# Patient Record
Sex: Male | Born: 1980 | Race: Black or African American | Hispanic: No | Marital: Married | Smoking: Never smoker
Health system: Southern US, Community
[De-identification: ages and names within clinical notes are randomized; demographics above are authoritative.]

## PROBLEM LIST (undated history)

## (undated) DIAGNOSIS — I10 Essential (primary) hypertension: Secondary | ICD-10-CM

## (undated) DIAGNOSIS — E785 Hyperlipidemia, unspecified: Secondary | ICD-10-CM

## (undated) DIAGNOSIS — F319 Bipolar disorder, unspecified: Secondary | ICD-10-CM

## (undated) HISTORY — DX: Hyperlipidemia, unspecified: E78.5

## (undated) HISTORY — DX: Essential (primary) hypertension: I10

---

## 2014-06-06 ENCOUNTER — Emergency Department (HOSPITAL_COMMUNITY): Payer: Self-pay

## 2014-06-06 ENCOUNTER — Emergency Department (HOSPITAL_COMMUNITY)
Admission: EM | Admit: 2014-06-06 | Discharge: 2014-06-08 | Disposition: A | Discharge status: Other Acute Inpatient Hospital | Payer: Self-pay | Attending: Emergency Medicine | Admitting: Emergency Medicine

## 2014-06-06 DIAGNOSIS — R000 Tachycardia, unspecified: Secondary | ICD-10-CM | POA: Insufficient documentation

## 2014-06-06 DIAGNOSIS — F23 Brief psychotic disorder: Secondary | ICD-10-CM | POA: Diagnosis present

## 2014-06-06 DIAGNOSIS — R4182 Altered mental status, unspecified: Secondary | ICD-10-CM | POA: Insufficient documentation

## 2014-06-06 HISTORY — DX: Bipolar disorder, unspecified: F31.9

## 2014-06-06 LAB — ETHANOL

## 2014-06-06 LAB — I-STAT TROPONIN, ED: Troponin i, poc: 0.08 ng/mL (ref 0.00–0.08)

## 2014-06-06 MED ORDER — LORAZEPAM 2 MG/ML IJ SOLN
2.0000 mg | Freq: Once | INTRAMUSCULAR | Status: AC
  Administered 2014-06-06: 2 mg via INTRAVENOUS
  Filled 2014-06-06: qty 1

## 2014-06-06 MED ORDER — SODIUM CHLORIDE 0.9 % IV BOLUS (SEPSIS)
1000.0000 mL | Freq: Once | INTRAVENOUS | Status: AC
  Administered 2014-06-06: 1000 mL via INTRAVENOUS

## 2014-06-06 MED ORDER — LORAZEPAM 1 MG PO TABS
2.0000 mg | ORAL_TABLET | Freq: Once | ORAL | Status: AC
  Administered 2014-06-06: 2 mg via ORAL
  Filled 2014-06-06: qty 2

## 2014-06-06 NOTE — ED Notes (Signed)
Pt refuses to allow this RN to carry out orders.

## 2014-06-06 NOTE — ED Notes (Signed)
Pt found wondering the street by [employer], pt not answering questions asked. Pt clammy, HR 160.

## 2014-06-06 NOTE — ED Notes (Signed)
MD at bedside. 

## 2014-06-06 NOTE — ED Provider Notes (Signed)
CSN: [id number]     Arrival date & time 06/06/14  2035 History   First MD Initiated Contact with Patient 06/06/14 2047     No chief complaint on file.  Steven Houston is a [age] y.o. male with an unknown past medical history who presents for altered mental status. This patient was seen by this examiner earlier this evening for altered mental status. After rehydration and observation, the patient had improvement in his mental status and began answering questions appropriately. The patient was then discharged when he was able to ambulate with plans to follow up with his PCP. Several hours later, the patient was brought in by the Police Department for altered mental status. The patient was reportedly walking down the street. The police foresaid the patient did not have any complaints but appeared altered. They brought the patient to the ED for evaluation.  On arrival, the patient appears significantly more altered than a discharge. The patient is diaphoretic, is not verbally communicating, and is appearing very anxious. The patient is biting down on his jaw and is staring at ED staff. The patient will not interact to provide review of systems. The patient's heart rate on arrival is in the 160s.   Given the patient's report to EMS earlier today about crack cocaine use, suspect ingestion between discharge and arrival.    (Consider location/radiation/quality/duration/timing/severity/associated sxs/prior Treatment) Patient is a 34 y.o. male presenting with altered mental status. The history is provided by the patient, the EMS personnel, medical records and the police. The history is limited by the condition of the patient. No language interpreter was used.  Altered Mental Status Presenting symptoms: behavior changes, confusion and partial responsiveness   Presenting symptoms: no lethargy   Severity:  Severe Most recent episode:  Today Episode history:  Multiple Timing:  Constant Progression:   Improving Chronicity:  Recurrent Context: not alcohol use   Associated symptoms: agitation   Associated symptoms: no abdominal pain, no difficulty breathing, no fever, no headaches, no light-headedness, no nausea, no seizures and no vomiting     No past medical history on file. No past surgical history on file. No family history on file. History  Substance Use Topics  . Smoking status: Not on file  . Smokeless tobacco: Not on file  . Alcohol Use: Not on file    Review of Systems  Unable to perform ROS Constitutional: Negative for fever.  Cardiovascular: Negative for chest pain.  Gastrointestinal: Negative for nausea, vomiting and abdominal pain.  Neurological: Negative for seizures, light-headedness and headaches.  Psychiatric/Behavioral: Positive for confusion and agitation.      Allergies  Review of patient's allergies indicates not on file.  Home Medications   Prior to Admission medications   Not on File   BP 138/93 mmHg  Pulse 96  Temp(Src) 98.6 F (37 C) (Oral)  Resp 18  SpO2 100% Physical Exam  Constitutional: He appears well-developed and well-nourished. No distress.  HENT:  Head: Normocephalic and atraumatic.  Mouth/Throat: No oropharyngeal exudate.  Eyes: Conjunctivae and EOM are normal. Pupils are equal, round, and reactive to light.  Neck: Normal range of motion.  Cardiovascular: Regular rhythm, normal heart sounds and intact distal pulses.  Tachycardia present.   No murmur heard. Pulmonary/Chest: Effort normal. No stridor. No respiratory distress. He has no wheezes. He exhibits no tenderness.  Abdominal: Soft. Bowel sounds are normal. He exhibits no distension. There is no tenderness. There is no rebound.  Musculoskeletal: He exhibits no tenderness.  Neurological: He is  alert. He exhibits normal muscle tone. Gait normal. GCS eye subscore is 4. GCS verbal subscore is 5. GCS motor subscore is 6.  Skin: Skin is warm. He is diaphoretic. No erythema. No  pallor.  Psychiatric: His mood appears anxious. He is agitated. He is not combative. He is noncommunicative.  Nursing note and vitals reviewed.   ED Course  Procedures (including critical care time) Labs Review Labs Reviewed  ACETAMINOPHEN LEVEL - Abnormal; Notable for the following:    Acetaminophen (Tylenol), Serum <10 (*)    All other components within normal limits  ETHANOL  SALICYLATE LEVEL  URINALYSIS, ROUTINE W REFLEX MICROSCOPIC (NOT AT [facility])  URINE RAPID DRUG SCREEN (HOSP PERFORMED) NOT AT [facility]  Rosezena Sensor, ED  I-STAT TROPOININ, ED    Imaging Review Ct Head Wo Contrast  06/07/2014   CLINICAL DATA:  Altered mental status.  EXAM: CT HEAD WITHOUT CONTRAST  TECHNIQUE: Contiguous axial images were obtained from the base of the skull through the vertex without intravenous contrast.  COMPARISON:  None.  FINDINGS: Skull and Sinuses:Negative for fracture or destructive process. The mastoids, middle ears, and imaged paranasal sinuses are clear of effusions.  Orbits: No acute abnormality.  Brain: No evidence of acute infarction, hemorrhage, hydrocephalus, or mass lesion/mass effect.  IMPRESSION: Negative head CT.   Electronically Signed   By: Marnee Spring M.D.   On: 06/07/2014 00:08     EKG Interpretation None      MDM   Steven Houston is a 33 y.o. male with an unknown past medical history who presents for altered mental status. As this is the second time that the patient has been seen by this examiner this evening, there is a marked change in the patient's mental status during his interval of discharge. On his return, the patient continued to be nonverbal and appears very anxious. The patient was diaphoretic and had a tachycardia in the 160s. The patient initially refused any IV placement, blood work, or urine studies. The patient was convinced to take an oral dose of Ativan. Following the medication, the patient had an IV placed and became more cooperative. The patient began  answering some questions. The patient denied any new medicines but began saying that "people were programming him."   Given the patient's altered mental status and her labs were repeated and a CT of the head was ordered. The patient began walking around the room and becoming slightly more agitated. The patient was then given a dose of IV Ativan which allowed the patient to rest.  The patient had a troponin obtained which was negative at 0.08 by i-STAT, and his ethanol was negative. The patient also had acetaminophen and salicylate levels drawn. The patient's heart rate improved following the Ativan. The patient decreased his heart rate from the 150s into the 90s.  Following the admission of his paranoid delusions of people programming him, as well as his failure to maintain his safety at discharge, the patient was involuntarily committed for further psychiatric and medical workup.   The patient appeared in safe condition at previous discharge however, the patient proved that he was unable to remain safe as he began wandering down the road and was altered. The involuntary commitment paperwork was filled out by the attending, Dr. Romeo Apple.  The patient's CT head was negative. The patient's alcohol Tylenol and salicylate levels were negative. Pending the patient's urinalysis and UDS, the patient will be medically cleared for further psychiatric evaluation and management.  This patient was seen  with Dr. Romeo Apple, emergency medicine attending.   Final diagnoses:  None          Theda Belfast, MD 06/07/14 1610  Theda Belfast, MD 06/07/14 9604  Purvis Sheffield, MD 06/07/14 [id number]

## 2014-06-06 NOTE — ED Notes (Signed)
CT called and informed pt is ready for CT.

## 2014-06-07 DIAGNOSIS — F23 Brief psychotic disorder: Secondary | ICD-10-CM | POA: Diagnosis present

## 2014-06-07 LAB — COMPREHENSIVE METABOLIC PANEL
ALK PHOS: 55 U/L (ref 38–126)
ALT: 22 U/L (ref 17–63)
ANION GAP: 8 (ref 5–15)
AST: 25 U/L (ref 15–41)
Albumin: 3.5 g/dL (ref 3.5–5.0)
BILIRUBIN TOTAL: 1 mg/dL (ref 0.3–1.2)
BUN: 14 mg/dL (ref 6–20)
CALCIUM: 8.9 mg/dL (ref 8.9–10.3)
CO2: 25 mmol/L (ref 22–32)
Chloride: 109 mmol/L (ref 101–111)
Creatinine, Ser: 0.68 mg/dL (ref 0.61–1.24)
Glucose, Bld: 97 mg/dL (ref 65–99)
POTASSIUM: 3.7 mmol/L (ref 3.5–5.1)
Sodium: 142 mmol/L (ref 135–145)
Total Protein: 6.2 g/dL — ABNORMAL LOW (ref 6.5–8.1)

## 2014-06-07 LAB — RAPID URINE DRUG SCREEN, HOSP PERFORMED
AMPHETAMINES: NOT DETECTED
BARBITURATES: NOT DETECTED
Benzodiazepines: POSITIVE — AB
COCAINE: NOT DETECTED
Opiates: NOT DETECTED
TETRAHYDROCANNABINOL: NOT DETECTED

## 2014-06-07 LAB — TSH: TSH: 0.953 u[IU]/mL (ref 0.350–4.500)

## 2014-06-07 LAB — URINALYSIS, ROUTINE W REFLEX MICROSCOPIC
Glucose, UA: NEGATIVE mg/dL
HGB URINE DIPSTICK: NEGATIVE
Ketones, ur: 40 mg/dL — AB
Leukocytes, UA: NEGATIVE
Nitrite: NEGATIVE
PH: 5.5 (ref 5.0–8.0)
PROTEIN: 30 mg/dL — AB
Specific Gravity, Urine: 1.029 (ref 1.005–1.030)
UROBILINOGEN UA: 1 mg/dL (ref 0.0–1.0)

## 2014-06-07 LAB — ACETAMINOPHEN LEVEL: Acetaminophen (Tylenol), Serum: <10 ug/mL — ABNORMAL LOW (ref 10–30)

## 2014-06-07 LAB — I-STAT TROPONIN, ED
Troponin i, poc: 0.01 ng/mL (ref 0.00–0.08)
Troponin i, poc: 0.02 ng/mL (ref 0.00–0.08)

## 2014-06-07 LAB — SALICYLATE LEVEL: Salicylate Lvl: <4 mg/dL (ref 2.8–30.0)

## 2014-06-07 LAB — URINE MICROSCOPIC-ADD ON

## 2014-06-07 MED ORDER — LORAZEPAM 2 MG/ML IJ SOLN
0.0000 mg | Freq: Two times a day (BID) | INTRAMUSCULAR | Status: DC
  Administered 2014-06-07: 1 mg via INTRAVENOUS

## 2014-06-07 MED ORDER — LORAZEPAM 1 MG PO TABS: 0.0000 mg | ORAL_TABLET | Freq: Four times a day (QID) | ORAL | Status: DC

## 2014-06-07 MED ORDER — SODIUM CHLORIDE 0.9 % IV BOLUS (SEPSIS)
1000.0000 mL | Freq: Once | INTRAVENOUS | Status: AC
  Administered 2014-06-07: 1000 mL via INTRAVENOUS

## 2014-06-07 MED ORDER — VITAMIN B-1 100 MG PO TABS
100.0000 mg | ORAL_TABLET | Freq: Every day | ORAL | Status: DC
  Filled 2014-06-07: qty 1

## 2014-06-07 MED ORDER — LORAZEPAM 2 MG/ML IJ SOLN
0.0000 mg | Freq: Four times a day (QID) | INTRAMUSCULAR | Status: DC
  Filled 2014-06-07: qty 1

## 2014-06-07 MED ORDER — THIAMINE HCL 100 MG/ML IJ SOLN
100.0000 mg | Freq: Every day | INTRAMUSCULAR | Status: DC
  Filled 2014-06-07: qty 2

## 2014-06-07 MED ORDER — LORAZEPAM 1 MG PO TABS: 0.0000 mg | ORAL_TABLET | Freq: Two times a day (BID) | ORAL | Status: DC

## 2014-06-07 NOTE — ED Notes (Signed)
The patient went into SVT up heart rate was up to 160bpm.  I advised Dr. Wilkie AyeHorton and she ordered an EKG.  Patient's heart rate came down to the 90's after about 2 minutes.  No other orders given.

## 2014-06-07 NOTE — [facility] Counselor (Signed)
TTS attempted to assess the Pt but the Pt would not communicate. Dr. Elsie SaasJonnalagadda was contacted. Dr. Elsie SaasJonnalagadda [facility]  Wolfgang PhoenixBrandi Doran Nestle, [facility] Triage Specialist

## 2014-06-07 NOTE — ED Notes (Signed)
Patient's IVC papers were given to Lequita HaltMorgan, Charity fundraiserN.  Patient was transferred back to D-34 to keep a closer eye on his condition.

## 2014-06-07 NOTE — Consult Note (Signed)
[facility] Face-to-Face Psychiatry Consult   Reason for Consult:  Psychosis Referring Physician:  EDP Patient Identification: Steven Houston MRN:  [id number] Principal Diagnosis: Acute psychosis Diagnosis:   Patient Active Problem List   Diagnosis Date Noted  . Acute psychosis [F29] 06/07/2014    Total Time spent with patient: 30 minutes  Subjective:   Steven Houston is a 34 y.o. male patient admitted with acute psychosis  HPI:  Steven Houston is an 35 y.o. Male seen for psychiatric consultation and evaluation of psychosis in [facility] emergency department. Patient was failed to respond to telly psychiatry and was seen face-to-face for this evaluation. Patient appeared lying down on his bed and turning to on his left, he has been poor historian, less verbally response during this evaluation. Patient occasionally nod his head and whispering but difficult to follow through his thought process at this time. Patient was brought in by [facility] department because he was found to be wandering on the street with the bizarre behaviors and psychotic statements like "is a still a blood bath out there? And "there are people eating each other out there". It is a blood bath. There are fires everywhere."He then asked if it was really 2016. The patient also said, "I drank nuclear stuff".He then said, "the beast has not been released."And "the monster has not been released for the blood bath." There was no collateral contact information.  Safety sitter stated that he was able to talk when he wants to and reportedly has a plan of leaving the hospital when he get rested well.   Past psychiatric history: Unknown Social history: Unknown   Past Medical History: No past medical history on file. No past surgical history on file. Family History: No family history on file. Social History:  History  Alcohol Use: Not on file     History  Drug Use Not on file    History   Social History  . Marital Status:  Unknown    Spouse Name: N/A  . Number of Children: N/A  . Years of Education: N/A   Social History Main Topics  . Smoking status: Not on file  . Smokeless tobacco: Not on file  . Alcohol Use: Not on file  . Drug Use: Not on file  . Sexual Activity: Not on file   Other Topics Concern  . Not on file   Social History Narrative  . No narrative on file   Additional Social History:    Pain Medications: UTA Prescriptions: UTA Over the Counter: UTA History of alcohol / drug use?: No history of alcohol / drug abuse Longest period of sobriety (when/how long): UTA                     Allergies:   Allergies  Allergen Reactions  . Haldol [Haloperidol Lactate]     Labs:  Results for orders placed or performed during the hospital encounter of 06/06/14 (from the past 48 hour(s))  Ethanol     Status: None   Collection Time: 06/06/14 10:40 PM  Result Value Ref Range   Alcohol, Ethyl (B) <5 <5 mg/dL    Comment:        LOWEST DETECTABLE LIMIT FOR SERUM ALCOHOL IS 11 mg/dL FOR MEDICAL PURPOSES ONLY   Acetaminophen level     Status: Abnormal   Collection Time: 06/06/14 10:40 PM  Result Value Ref Range   Acetaminophen (Tylenol), Serum <10 (L) 10 - 30 ug/mL    Comment:  THERAPEUTIC CONCENTRATIONS VARY SIGNIFICANTLY. A RANGE OF 10-30 ug/mL MAY BE AN EFFECTIVE CONCENTRATION FOR MANY PATIENTS. HOWEVER, SOME ARE BEST TREATED AT CONCENTRATIONS OUTSIDE THIS RANGE. ACETAMINOPHEN CONCENTRATIONS >150 ug/mL AT 4 HOURS AFTER INGESTION AND >50 ug/mL AT 12 HOURS AFTER INGESTION ARE OFTEN ASSOCIATED WITH TOXIC REACTIONS.   Salicylate level     Status: None   Collection Time: 06/06/14 10:40 PM  Result Value Ref Range   Salicylate Lvl <4.0 2.8 - 30.0 mg/dL  I-Stat Troponin, ED - 0, 3, 6 hours ([facility])     Status: None   Collection Time: 06/06/14 10:47 PM  Result Value Ref Range   Troponin i, poc 0.08 0.00 - 0.08 ng/mL   Comment 3            Comment: Due to the  release kinetics of cTnI, a negative result within the first hours of the onset of symptoms does not rule out myocardial infarction with certainty. If myocardial infarction is still suspected, repeat the test at appropriate intervals.   Urinalysis, Routine w reflex microscopic     Status: Abnormal   Collection Time: 06/07/14 12:12 AM  Result Value Ref Range   Color, Urine YELLOW YELLOW   APPearance CLOUDY (A) CLEAR   Specific Gravity, Urine 1.029 1.005 - 1.030   pH 5.5 5.0 - 8.0   Glucose, UA NEGATIVE NEGATIVE mg/dL   Hgb urine dipstick NEGATIVE NEGATIVE   Bilirubin Urine MODERATE (A) NEGATIVE   Ketones, ur 40 (A) NEGATIVE mg/dL   Protein, ur 30 (A) NEGATIVE mg/dL   Urobilinogen, UA 1.0 0.0 - 1.0 mg/dL   Nitrite NEGATIVE NEGATIVE   Leukocytes, UA NEGATIVE NEGATIVE  Drug screen panel, emergency     Status: Abnormal   Collection Time: 06/07/14 12:12 AM  Result Value Ref Range   Opiates NONE DETECTED NONE DETECTED   Cocaine NONE DETECTED NONE DETECTED   Benzodiazepines POSITIVE (A) NONE DETECTED   Amphetamines NONE DETECTED NONE DETECTED   Tetrahydrocannabinol NONE DETECTED NONE DETECTED   Barbiturates NONE DETECTED NONE DETECTED    Comment:        DRUG SCREEN FOR MEDICAL PURPOSES ONLY.  IF CONFIRMATION IS NEEDED FOR ANY PURPOSE, NOTIFY LAB WITHIN 5 DAYS.        LOWEST DETECTABLE LIMITS FOR URINE DRUG SCREEN Drug Class       Cutoff (ng/mL) Amphetamine      1000 Barbiturate      200 Benzodiazepine   200 Tricyclics       300 Opiates          300 Cocaine          300 THC              50   Urine microscopic-add on     Status: None   Collection Time: 06/07/14 12:12 AM  Result Value Ref Range   Squamous Epithelial / LPF RARE RARE   WBC, UA 0-2 <3 WBC/hpf   RBC / HPF 0-2 <3 RBC/hpf   Bacteria, UA RARE RARE   Urine-Other AMORPHOUS URATES/PHOSPHATES   I-Stat Troponin, ED - 0, 3, 6 hours ([facility])     Status: None   Collection Time: 06/07/14 12:55 AM  Result Value Ref  Range   Troponin i, poc 0.01 0.00 - 0.08 ng/mL   Comment 3            Comment: Due to the release kinetics of cTnI, a negative result within the first hours of the  onset of symptoms does not rule out myocardial infarction with certainty. If myocardial infarction is still suspected, repeat the test at appropriate intervals.   I-Stat Troponin, ED - 0, 3, 6 hours (not at [facility])     Status: None   Collection Time: 06/07/14  3:31 AM  Result Value Ref Range   Troponin i, poc 0.02 0.00 - 0.08 ng/mL   Comment 3            Comment: Due to the release kinetics of cTnI, a negative result within the first hours of the onset of symptoms does not rule out myocardial infarction with certainty. If myocardial infarction is still suspected, repeat the test at appropriate intervals.     Vitals: Blood pressure 133/66, pulse 121, temperature 97.9 F (36.6 C), temperature source Axillary, resp. rate 26, SpO2 99 %.  Risk to Self: Suicidal Ideation: No (UTA) Suicidal Intent: No (UTA) Is patient at risk for suicide?: No (UTA) Suicidal Plan?: No (UTA) Access to Means: No (UTA) What has been your use of drugs/alcohol within the last 12 months?: UTA How many times?: 0 (UTA) Other Self Harm Risks:  (UTA) Triggers for Past Attempts: Other (Comment) (UTA) Intentional Self Injurious Behavior: None (UTA) Risk to Others: Homicidal Ideation: No Thoughts of Harm to Others: No (UTA) Current Homicidal Intent:  (UTA) Current Homicidal Plan:  (UTA) Access to Homicidal Means:  (UTa) Identified Victim: UTA History of harm to others?: No (UTA) Assessment of Violence: None Noted Violent Behavior Description: UTA Does patient have access to weapons?: No (UTA) Criminal Charges Pending?: No (UTA) Does patient have a court date: No (UTA) Prior Inpatient Therapy: Prior Inpatient Therapy: No (UTA) Prior Therapy Dates: NA Prior Therapy Facilty/Provider(s): NA Reason for Treatment: NA Prior Outpatient Therapy: Prior  Outpatient Therapy: No (UTA) Prior Therapy Dates: NA Prior Therapy Facilty/Provider(s): NA Reason for Treatment: NA Does patient have an ACCT team?: Unknown Does patient have Intensive In-House Services?  : Unknown Does patient have [facility] services? : Unknown Does patient have P4CC services?: Unknown  Current Facility-Administered Medications  Medication Dose Route Frequency Provider Last Rate Last Dose  . LORazepam (ATIVAN) injection 0-4 mg  0-4 mg Intravenous 4 times per day Shon Baton, MD   0 mg at 06/07/14 0910  . LORazepam (ATIVAN) injection 0-4 mg  0-4 mg Intravenous Q12H Shon Baton, MD   1 mg at 06/07/14 [phone]  . LORazepam (ATIVAN) tablet 0-4 mg  0-4 mg Oral 4 times per day Shon Baton, MD   0 mg at 06/07/14 0912  . LORazepam (ATIVAN) tablet 0-4 mg  0-4 mg Oral Q12H Shon Baton, MD   0 mg at 06/07/14 0911  . thiamine (B-1) injection 100 mg  100 mg Intravenous Daily Shon Baton, MD   100 mg at 06/07/14 0951  . thiamine (VITAMIN B-1) tablet 100 mg  100 mg Oral Daily Shon Baton, MD   100 mg at 06/07/14 9604   No current outpatient prescriptions on file.    Musculoskeletal: Strength & Muscle Tone: decreased Gait & Station: unable to stand Patient leans: N/A  Psychiatric Specialty Exam: Physical Exam  ROS  Blood pressure 133/66, pulse 121, temperature 97.9 F (36.6 C), temperature source Axillary, resp. rate 26, SpO2 99 %.There is no height or weight on file to calculate BMI.  General Appearance: Disheveled and Guarded  Eye Contact::  Minimal  Speech:  Blocked and Slow  Volume:  Decreased  Mood:  Depressed  Affect:  Blunt  Thought Process:  Disorganized and Irrelevant  Orientation:  Full (Time, Place, and Person)  Thought Content:  Ideas of Reference:   Delusions and Paranoid Ideation  Suicidal Thoughts:  No  Homicidal Thoughts:  No  Memory:  Immediate;   Poor Recent;   Poor  Judgement:  Poor  Insight:  Shallow  Psychomotor  Activity:  Decreased  Concentration:  Poor  Recall:  Poor  Fund of Knowledge:Poor  Language: Fair  Akathisia:  Negative  Handed:  Right  AIMS (if indicated):     Assets:  Others:   unable to assess at this time  ADL's:  Impaired  Cognition: Impaired,  Moderate  Sleep:      Medical Decision Making: New problem, with additional work up planned, Review of Psycho-Social Stressors (1), Review or order clinical lab tests (1), Review of Last Therapy Session (1), Review or order medicine tests (1), Review of Medication Regimen & Side Effects (2) and Review of New Medication or Change in Dosage (2)  Treatment Plan Summary: Patient presented with acute psychosis and questionable history of schizophrenia or drug-induced psychosis. Patient has blunt affect, limited verbal responses, poor eye contact and dysphoric and occasionally. Patient was noticed talking with the staff behind the door but not given clear information about his past or present problems. No collateral Information is available at this time. Daily contact with patient to assess and evaluate symptoms and progress in treatment and Medication management  Plan:  Will check comprehensive metabolic panel for possible electrolyte imbalance or metabolic problems along with thyroid panel for hypo-or hyperthyroidism. Psychosis NOS: May use risperidone 0.5 mg twice daily and Haldol 2 mg IM every 6 hours for agitation and aggressive behavior Monitor for alcohol withdrawal symptoms Recommend psychiatric Inpatient admission when medically cleared. Supportive therapy provided about ongoing stressors.  Appreciate psychiatric consultation Please contact [phone] or [phone] if needs further assistance  Disposition: Patient need to be reevaluated for acute psychiatric hospitalization as patient is poor historian and unable to gather most of the collecting information at this time   Judy Pollman,JANARDHAHA R. 06/07/2014 12:51 PM

## 2014-06-07 NOTE — ED Notes (Signed)
Patient's oxygen levels dropped to the 80's so I placed him on 2L/[place].  MD aware.

## 2014-06-07 NOTE — BH Assessment (Addendum)
Tele Assessment Note   Steven OvensMichael Houston is an 34 y.o. male. Writer attempted to conduct an assessment. Pt would not respond to the writer. There was no collateral contact information in the Pt's chart.   Collateral information gathered from RN's notes: "the patient woke up and asked, "is there still a blood bath out there?" I advised him that everything was okay and that [facility]. He then said, "there are people eating each other out there. It is a blood bath. There are fires everywhere." I reoriented him and also advised him that he was walking out in front of traffic and the police brought him in. He then asked if it was really 2016. The patient also said, "I drank nuclear stuff". I asked if someone had given it to him and he said he got it. He then said, "the beast has not been released." I did not understand what he has said so I asked him to repeat it and he said, "the monster has not been released for the blood bath." I then told him it was almost 0500hrs and he should get some rest. He said, "yes ma'am."   Writer contacted Dr. Shela CommonsJ to assess the Pt. Dr[facility]J [facility]  Axis I: Psychotic Disorder NOS Axis II: Deferred Axis III: No past medical history on file. Axis IV: problems with access to health care services Axis V: 31-40 impairment in reality testing  Past Medical History: No past medical history on file.  No past surgical history on file.  Family History: No family history on file.  Social History:  has no tobacco, alcohol, and drug history on file.  Additional Social History:  Alcohol / Drug Use Pain Medications: UTA Prescriptions: UTA Over the Counter: UTA History of alcohol / drug use?: No history of alcohol / drug abuse Longest period of sobriety (when/how long): UTA  CIWA: CIWA-Ar BP: 143/88 mmHg Pulse Rate: 111 Nausea and Vomiting: no nausea and no vomiting Tactile Disturbances: none Tremor: no tremor Auditory  Disturbances: mild harshness or ability to frighten Paroxysmal Sweats: three Visual Disturbances: severe hallucinations Anxiety: no anxiety, at ease Headache, Fullness in Head: none present Agitation: normal activity Orientation and Clouding of Sensorium: oriented and can do serial additions CIWA-Ar Total: 10 COWS:    PATIENT STRENGTHS: (choose at least two) Motivation for treatment/growth Physical Health  Allergies:  Allergies  Allergen Reactions  . Haldol [Haloperidol Lactate]     Home Medications:  (Not in a hospital admission)  OB/GYN Status:  No LMP for male patient.  General Assessment Data [facility] Assessment: In system Is this a Tele or Face-to-Face Assessment?: Tele Assessment Is this an Initial Assessment or a Re-assessment for this encounter?: Initial Assessment Marital status: Other (comment) Maiden name: Cristela FeltDuff Is patient pregnant?: No Pregnancy Status: No Living Arrangements:  (UTA) Can pt return to current living arrangement?:  (UTA) Admission Status: Involuntary Is patient capable of signing voluntary admission?: No Referral Source:  ([employer]) Insurance type: SP     Crisis Care Plan Living Arrangements:  (UTA) Name of Psychiatrist: UTA Name of Therapist: UTA  Education Status Is patient currently in school?: No Current Grade: UTA Highest grade of school patient has completed: UTA Name of school: UTA Contact person: NA  Risk to self with the past 6 months Suicidal Ideation: No (UTA) Has patient been a risk to self within the past 6 months prior to admission? : Other (  comment) (UTA) Suicidal Intent: No (UTA) Has patient had any suicidal intent within the past 6 months prior to admission? : Other (comment) (UTA) Is patient at risk for suicide?: No (UTA) Suicidal Plan?: No (UTA) Has patient had any suicidal plan within the past 6 months prior to admission? : Other (comment) (UTA) Access to Means: No (UTA) What has been  your use of drugs/alcohol within the last 12 months?: UTA Previous Attempts/Gestures: No (UTA) How many times?: 0 (UTA) Other Self Harm Risks:  (UTA) Triggers for Past Attempts: Other (Comment) (UTA) Intentional Self Injurious Behavior: None (UTA) Family Suicide History: Unknown (UTA) Recent stressful life event(s): Other (Comment) (UTA) Persecutory voices/beliefs?: No (UTA) Depression: No (UTA) Depression Symptoms:  (UTA) Substance abuse history and/or treatment for substance abuse?:  (UTA) Suicide prevention information given to non-admitted patients: Not applicable  Risk to Others within the past 6 months Homicidal Ideation: No Does patient have any lifetime risk of violence toward others beyond the six months prior to admission? : Unknown Thoughts of Harm to Others: No (UTA) Current Homicidal Intent:  (UTA) Current Homicidal Plan:  (UTA) Access to Homicidal Means:  (UTa) Identified Victim: UTA History of harm to others?: No (UTA) Assessment of Violence: None Noted Violent Behavior Description: UTA Does patient have access to weapons?: No (UTA) Criminal Charges Pending?: No (UTA) Does patient have a court date: No (UTA) Is patient on probation?: Unknown  Psychosis Hallucinations: None noted (UTA) Delusions: None noted (UTA)  Mental Status Report Appearance/Hygiene: Unable to Assess Eye Contact: Unable to Assess Motor Activity: Unable to assess Speech: Unable to assess Level of Consciousness: Unable to assess Mood: Other (Comment) (UTA) Affect: Other (Comment) (UTA) Anxiety Level: None (UTA) Thought Processes: Unable to Assess Judgement: Unable to Assess Orientation: Unable to assess Obsessive Compulsive Thoughts/Behaviors: Unable to Assess  Cognitive Functioning Concentration: Unable to Assess Memory: Unable to Assess IQ: Average (UTA) Insight: Unable to Assess Impulse Control: Unable to Assess Appetite: Fair (UTA) Weight Loss: 0 Weight Gain: 0 Sleep:  Unable to Assess Total Hours of Sleep: 0 Vegetative Symptoms: Unable to Assess  ADLScreening [facility] Assessment Services) Patient's cognitive ability adequate to safely complete daily activities?: Yes Patient able to express need for assistance with ADLs?: No Independently performs ADLs?: Yes (appropriate for developmental age)  Prior Inpatient Therapy Prior Inpatient Therapy: No (UTA) Prior Therapy Dates: NA Prior Therapy Facilty/Provider(s): NA Reason for Treatment: NA  Prior Outpatient Therapy Prior Outpatient Therapy: No (UTA) Prior Therapy Dates: NA Prior Therapy Facilty/Provider(s): NA Reason for Treatment: NA Does patient have an ACCT team?: Unknown Does patient have Intensive In-House Services?  : Unknown Does patient have [facility] services? : Unknown Does patient have P4CC services?: Unknown  ADL Screening (condition at time of admission) Patient's cognitive ability adequate to safely complete daily activities?: Yes Is the patient deaf or have difficulty hearing?: No Does the patient have difficulty seeing, even when wearing glasses/contacts?: No Does the patient have difficulty concentrating, remembering, or making decisions?: No Patient able to express need for assistance with ADLs?: No Does the patient have difficulty dressing or bathing?: No Independently performs ADLs?: Yes (appropriate for developmental age) Does the patient have difficulty walking or climbing stairs?: No Weakness of Legs: None Weakness of Arms/Hands: None       Abuse/Neglect Assessment (Assessment to be complete while patient is alone) Physical Abuse: Denies Verbal Abuse: Denies Sexual Abuse: Denies Exploitation of patient/patient's resources: Denies Self-Neglect: Denies Values / Beliefs Cultural Requests During Hospitalization: None Spiritual Requests During Hospitalization:  None   Advance Directives (For Healthcare) Does patient have an advance directive?: No Would patient like  information on creating an advanced directive?: No - patient declined information    Additional Information 1:1 In Past 12 Months?: No CIRT Risk: No Elopement Risk: No Does patient have medical clearance?: No     Disposition:  Disposition Initial Assessment Completed for this Encounter: Yes Disposition of Patient: Other dispositions (Pending psych evaluation by Dr. Shela Commons) Other disposition(s): Other (Comment)  Jerrica Thorman D 06/07/2014 10:52 AM

## 2014-06-07 NOTE — ED Notes (Signed)
Patient is snoring. Sitter at the bedside. Patient is placed on the cardiac monitor.

## 2014-06-07 NOTE — Progress Notes (Signed)
CSW seeking inpt placement for pt as he awaits re-evaluation 06/08/14 a.m.   [facility] per [facility]- per Alfredia Clientarla [facility]- per Jill AlexandersJustin (no IPRS beds today but fax for review for waitlist)  At capacity: [facility]- [facility]- per Jonah Blue[facility]resa [facility]- [facility]- [facility]- per [facility]- [facility]- [facility]- per Alinda Moneyony  Left voicemail for [facility].  Ilean SkillMeghan Jadalynn Burr, MSW, LCSWA Clinical Social Work, Disposition  06/07/2014 [phone]

## 2014-06-07 NOTE — ED Notes (Signed)
Patient is back to sleep, and snoring .  Sitter is at the bedside.

## 2014-06-07 NOTE — ED Notes (Signed)
BH tried to telepsych with patient.  Patient would not talk with Gearldine BienenstockBrandy but would only look down.  Gearldine BienenstockBrandy is going to try and get Dr. Shela CommonsJ to round on the patient.

## 2014-06-07 NOTE — ED Notes (Signed)
In doing hourly rounding, the patient woke up and asked, "is there still a blood bath out there?"  I advised him that everything was okay and [facility]  He then said, "there are people eating each other out there.  It is a blood bath. There are fires everywhere."  I reoriented him and also advised him that he was walking out in front of traffic and the police brought him in.  He then asked if it was really 2016.  The patient also said, "I drank nuclear stuff".  I asked if someone had given it to him and he said he got it.  He then said, "the beast has not been released."  I did not understand what he has said so I asked him to repeat it and he said, "the monster has not been released for the blood bath."  I then told him it was almost 0500hrs and he should get some rest. He said, "yes ma'am."

## 2014-06-07 NOTE — ED Notes (Signed)
Patient eating dinner tray. 

## 2014-06-07 NOTE — ED Provider Notes (Signed)
Patient signed out and shift pending urinalysis. Urinalysis is unremarkable. Patient is status post Ativan and unable to be evaluated by TTS at this time.  Patient was initially moved to pod see. However, he had multiple episodes of tachycardia into the 150s. This seemed to be intermittent and unprovoked. Patient was given a second liter of fluid and moved back to the main ER. On questioning, patient reports he has a history of tachycardia but will not elaborate. He is unsure whether he is on medications.  Denies alcohol abuse. Patient was placed on CIWA.  Patient will need to be reevaluated. Repeat EKG appears to be sinus tachycardia.  Will sign out to to oncoming physician.  Shon Batonourtney F Horton, MD 06/07/14 [phone]

## 2014-06-07 NOTE — ED Notes (Signed)
MD Jonnalagadda at the bedside.

## 2014-06-07 NOTE — ED Notes (Signed)
Patient is asleep with sitter at the bedside. 

## 2014-06-07 NOTE — ED Notes (Signed)
Patient awaken from his sleep, began to ask if there was a blood bath out there in the street from people eating other people. Patient continued to say that there was a monster (a beast) that was not yet release for the blood bath. Patient stated that he had taken some things that he shouldn't have taken. Patient went on to state that he had drunken some nuclear liquid. Nurse encouraged patient to relax and try to get some rest, assured him that he was safe in the hospital.

## 2014-06-07 NOTE — Progress Notes (Addendum)
Patient to be re-evaluated on Fri 5/27 morning.  Pt's referral was followed up at/faxed to: [facility] - per Morrie SheldonAshley, re-fax it. Referral faxed. Per Beth, pt on watlist for a IPRS bed. (Beth inquired if pt ambulatory/if does ADL's, Waynetta SandyBeth was informed that per RN Windy KalataYasemia, pt refuses to do his ADL's and not ambulatory). [facility], re-fax referral at [phone]. Referral faxed. [facility] - per Thayer Ohmhris, referral received and will be reviewed by am staff. No beds tonight but d/cs tomorrow.Referral faxed. [facility] - per Ray[facility] Citizenaroline, fax referral for the waitlist. Referral faxed. [facility] - per Dennie BiblePat, fax referral for waitlist.Referral faxed. [facility]- per Dewayne HatchAnn, fax referral.Referral faxed.  At capacity: [facility]- per Brion [facility]iment[facility]an [facility]   CSW will continue to seek placement.  Melbourne Abtsatia Aireana Ryland, LCSWA Disposition staff 06/07/2014 6:32 PM

## 2014-06-08 ENCOUNTER — Encounter (HOSPITAL_COMMUNITY): Payer: Self-pay | Admitting: Emergency Medicine

## 2014-06-08 DIAGNOSIS — F29 Unspecified psychosis not due to a substance or known physiological condition: Secondary | ICD-10-CM

## 2014-06-08 LAB — T3, FREE: T3 FREE: 3.4 pg/mL (ref 2.0–4.4)

## 2014-06-08 NOTE — ED Notes (Signed)
Attempted to give report to [facility] states they have not received first report and would need more time,.

## 2014-06-08 NOTE — Progress Notes (Signed)
[facility] can be transported by [facility] once order for transfer completed.   RN aware of plan and agreeable.  Deretha EmoryHannah Hillis Mcphatter LCSW, MSW Clinical Social Work: Emergency Room [phone]
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
## 2014-06-08 NOTE — ED Notes (Signed)
Pt awoken this AM from a sleep. PT cooperative with talking about present situation. Reports homeless for the last several months. No recollection of hallucinations, or last 2 day hospital stay. Pt reoriented to present situation and date. Reports hx of bipolar. Noted in chart. States hasn't taken meds for many months, takes Wellbutrin. Does have goals to get back on medications and begin working again.

## 2014-06-08 NOTE — Progress Notes (Signed)
Pt accepted to [facility] by Dr. Otelia SanteeFarrah per Wynona Caneshristine. Report [phone]. Requested pt arrive before 3pm if possible. Requested IVC copies be faxed to [phone].  CSW spoke with [facility] SW re: pt's placement.  Ilean SkillMeghan Nyasha Rahilly, MSW, LCSWA Clinical Social Work, Disposition  06/08/2014 [phone]

## 2015-10-05 ENCOUNTER — Emergency Department (HOSPITAL_COMMUNITY): Payer: Self-pay

## 2015-10-05 ENCOUNTER — Encounter (HOSPITAL_COMMUNITY): Payer: Self-pay

## 2015-10-05 ENCOUNTER — Emergency Department (HOSPITAL_COMMUNITY)
Admission: EM | Admit: 2015-10-05 | Discharge: 2015-10-05 | Disposition: A | Discharge status: Home / Self Care | Payer: Self-pay | Attending: Emergency Medicine | Admitting: Emergency Medicine

## 2015-10-05 DIAGNOSIS — Y939 Activity, unspecified: Secondary | ICD-10-CM | POA: Insufficient documentation

## 2015-10-05 DIAGNOSIS — Y999 Unspecified external cause status: Secondary | ICD-10-CM | POA: Insufficient documentation

## 2015-10-05 DIAGNOSIS — S0083XA Contusion of other part of head, initial encounter: Secondary | ICD-10-CM | POA: Insufficient documentation

## 2015-10-05 DIAGNOSIS — Y929 Unspecified place or not applicable: Secondary | ICD-10-CM | POA: Insufficient documentation

## 2015-10-05 DIAGNOSIS — H1131 Conjunctival hemorrhage, right eye: Secondary | ICD-10-CM

## 2015-10-05 MED ORDER — ACETAMINOPHEN 500 MG PO TABS
1000.0000 mg | ORAL_TABLET | Freq: Once | ORAL | Status: AC
  Administered 2015-10-05: 1000 mg via ORAL
  Filled 2015-10-05: qty 2

## 2015-10-05 MED ORDER — FLUORESCEIN SODIUM 1 MG OP STRP
1.0000 | ORAL_STRIP | Freq: Once | OPHTHALMIC | Status: AC
  Administered 2015-10-05: 1 via OPHTHALMIC
  Filled 2015-10-05: qty 1

## 2015-10-05 MED ORDER — TETRACAINE HCL 0.5 % OP SOLN
1.0000 [drp] | Freq: Once | OPHTHALMIC | Status: AC
  Administered 2015-10-05: 1 [drp] via OPHTHALMIC
  Filled 2015-10-05: qty 2

## 2015-10-05 MED ORDER — HYDROCODONE-ACETAMINOPHEN 5-325 MG PO TABS: 1.0000 | ORAL_TABLET | Freq: Four times a day (QID) | ORAL | 0 refills | Status: DC | PRN

## 2015-10-05 MED ORDER — POLYMYXIN B-TRIMETHOPRIM 10000-0.1 UNIT/ML-% OP SOLN
1.0000 [drp] | OPHTHALMIC | Status: DC
  Administered 2015-10-05: 1 [drp] via OPHTHALMIC
  Filled 2015-10-05: qty 10
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
## 2015-10-05 NOTE — ED Notes (Signed)
PA-C to see and assess pt before RN assessment. See PA note.  

## 2015-10-05 NOTE — ED Notes (Signed)
Patient verbalized understanding of discharge instructions and denies any further needs or questions at this time. VS stable. Patient ambulatory with steady gait. Pt declined wheelchair, RN escorted to ED entrance.   

## 2015-10-05 NOTE — Discharge Instructions (Signed)
Please instill 1 drop every 4 hours that your are awake for 5 days.

## 2016-06-19 ENCOUNTER — Ambulatory Visit (HOSPITAL_COMMUNITY)
Admission: EM | Admit: 2016-06-19 | Discharge: 2016-06-19 | Disposition: A | Discharge status: Home / Self Care | Payer: Self-pay | Attending: Internal Medicine | Admitting: Internal Medicine

## 2016-06-19 ENCOUNTER — Encounter (HOSPITAL_COMMUNITY): Payer: Self-pay | Admitting: *Deleted

## 2016-06-19 DIAGNOSIS — I10 Essential (primary) hypertension: Secondary | ICD-10-CM

## 2016-06-19 MED ORDER — AMLODIPINE BESYLATE 5 MG PO TABS: 5.0000 mg | ORAL_TABLET | Freq: Every day | ORAL | 0 refills | Status: DC

## 2016-06-19 NOTE — ED Provider Notes (Signed)
CSN: [id number]     Arrival date & time 06/19/16  1717 History   First MD Initiated Contact with Patient 06/19/16 1815     Chief Complaint  Patient presents with  . Hypertension   (Consider location/radiation/quality/duration/timing/severity/associated sxs/prior Treatment) 36 year old male resident of [facility] couple times in the past week or so. He was told that his blood pressure was elevated. He was also advised to follow up with community health and wellness which she has not done yet. Instead, he came to the urgent care today to have his blood pressure checked and to start medicines if necessary. He said a mild headache but otherwise asymptomatic.      Past Medical History:  Diagnosis Date  . Bipolar disorder (HCC)    History reviewed. No pertinent surgical history. No family history on file. Social History  Substance Use Topics  . Smoking status: Never Smoker  . Smokeless tobacco: Never Used  . Alcohol use Not on file    Review of Systems  Constitutional: Negative.   HENT: Negative.   Eyes: Negative.   Respiratory: Negative.   Cardiovascular: Negative.   Genitourinary: Negative.   Musculoskeletal: Negative.   Neurological: Positive for headaches.  All other systems reviewed and are negative.   Allergies  Haldol [haloperidol lactate]  Home Medications   Prior to Admission medications   Medication Sig Start Date End Date Taking? Authorizing Provider  amLODipine (NORVASC) 5 MG tablet Take 1 tablet (5 mg total) by mouth daily. 06/19/16   Hayden RasmussenMabe, Charlie Seda, NP  divalproex (DEPAKOTE) 500 MG DR tablet Take 500 mg by mouth 2 (two) times daily.    [provider]  lamoTRIgine (LAMICTAL) 25 MG tablet Take 50 mg by mouth at bedtime.    [provider]  risperiDONE (RISPERDAL) 2 MG tablet Take 2 mg by mouth at bedtime.    [provider]   Meds Ordered and Administered this Visit  Medications -  No data to display  BP (!) 148/98 (BP Location: Right Arm)   Pulse 72   Temp 98.6 F (37 C) (Oral)   Resp 18   SpO2 100%  No data found.   Physical Exam  Constitutional: He is oriented to person, place, and time. He appears well-developed and well-nourished. No distress.  Eyes: EOM are normal.  Neck: Normal range of motion. Neck supple.  Cardiovascular: Normal rate and regular rhythm.   Pulmonary/Chest: Effort normal. No respiratory distress.  Musculoskeletal: He exhibits no edema.  Neurological: He is alert and oriented to person, place, and time. He exhibits normal muscle tone.  Skin: Skin is warm and dry.  Psychiatric: He has a normal mood and affect.  Nursing note and vitals reviewed.   Urgent Care Course     Procedures (including critical care time)  Labs Review Labs Reviewed - No data to display  Imaging Review No results found.   Visual Acuity Review  Right Eye Distance:   Left Eye Distance:   Bilateral Distance:    Right Eye Near:   Left Eye Near:    Bilateral Near:         MDM   1. Essential hypertension    Call community health and wellness on Monday and make an appointment as soon as possible. There is a risk of taking medications prescribed by a health care provider without obtaining a complete history, labwork or proper physical exam, etc. This cannot be completed adequately at  an urgent care. There can be multiple problems, some serious,  associated with medications and undetermined conditions of your health status. This action is performed as a last resort in order to supply you with medication as we are not a primary care facility. By receiving these prescriptions you are ackowleging and accepting these risks and will not hold the prescriber or any agent of [facility] as responsible for any adverse outcomes.  Meds ordered this encounter  Medications  . amLODipine (NORVASC) 5 MG tablet    Sig: Take 1 tablet (5  mg total) by mouth daily.    Dispense:  30 tablet    Refill:  0    Order Specific Question:   Supervising Provider    Answer:   Eustace Moore [[id number]]      Hayden Rasmussen, NP 06/19/16 1845

## 2016-06-19 NOTE — Discharge Instructions (Signed)
Call community health and wellness on Monday and make an appointment as soon as possible. There is a risk of taking medications prescribed by a health care provider without obtaining a complete history, labwork or proper physical exam, etc. This cannot be completed adequately at an urgent care. There can be multiple problems, some serious,  associated with medications and undetermined conditions of your health status. This action is performed as a last resort in order to supply you with medication as we are not a primary care facility. By receiving these prescriptions you are ackowleging and accepting these risks and will not hold the prescriber or any agent of [facility] as responsible for any adverse outcomes.

## 2016-06-19 NOTE — ED Triage Notes (Signed)
Pt  Reports     Symptoms  Of    Of  Elevated   Blood  Pressure      Several  Days  Ago     When     Being  [facility]  [facility]    -  The  Pt  Is  A  Resident of  [facility]  [facility]      He  Takes  No  bp  meds          For hypertension

## 2016-07-09 ENCOUNTER — Encounter (INDEPENDENT_AMBULATORY_CARE_PROVIDER_SITE_OTHER): Payer: Self-pay | Admitting: Physician Assistant

## 2016-07-09 ENCOUNTER — Ambulatory Visit (INDEPENDENT_AMBULATORY_CARE_PROVIDER_SITE_OTHER): Payer: Self-pay | Admitting: Physician Assistant

## 2016-07-09 VITALS — BP 119/82 | HR 94 | Temp 98.6°F | Ht 65.0 in | Wt 248.2 lb

## 2016-07-09 DIAGNOSIS — R030 Elevated blood-pressure reading, without diagnosis of hypertension: Secondary | ICD-10-CM

## 2016-07-09 DIAGNOSIS — Z23 Encounter for immunization: Secondary | ICD-10-CM

## 2016-07-09 NOTE — Progress Notes (Signed)
Subjective:  Patient ID: Steven Houston, male    DOB: 1980-01-29  Age: 35 y.o. MRN: [id number]  CC: HTN  HPI Steven Houston is a 36 y.o. male with a PMH of bipolar disorder presents on behest of Steven Houston. Steven Houston has found his blood pressure to be elevated three times. He went to urgent care before coming here and was prescribed Amlodipine 5mg  on 06/19/16. His blood pressure was 148/98 at Urgent Care. Has not taken Amlodipine for 3 weeks. BP is 119/82. Has lost 10 lbs recently and is possibly contributing to his lower blood pressure. Feels generally well. Has plans to lose more weight. Says he was 180 lbs one year ago and went up to 250lbs once he got off of drugs. Does not endorse any other symptoms.      Outpatient Medications Prior to Visit  Medication Sig Dispense Refill  . divalproex (DEPAKOTE) 500 MG DR tablet Take 500 mg by mouth 2 (two) times daily.    Marland Kitchen lamoTRIgine (LAMICTAL) 25 MG tablet Take 50 mg by mouth at bedtime.    . risperiDONE (RISPERDAL) 2 MG tablet Take 2 mg by mouth at bedtime.    Marland Kitchen amLODipine (NORVASC) 5 MG tablet Take 1 tablet (5 mg total) by mouth daily. (Patient not taking: Reported on 07/09/2016) 30 tablet 0   No facility-administered medications prior to visit.      ROS Review of Systems  Constitutional: Negative for chills, fever and malaise/fatigue.  Eyes: Negative for blurred vision.  Respiratory: Negative for shortness of breath.   Cardiovascular: Negative for chest pain and palpitations.  Gastrointestinal: Negative for abdominal pain and nausea.  Genitourinary: Negative for dysuria and hematuria.  Musculoskeletal: Negative for joint pain and myalgias.  Skin: Negative for rash.  Neurological: Negative for tingling and headaches.  Psychiatric/Behavioral: Negative for depression. The patient is not nervous/anxious.     Objective:  BP 119/82 (BP Location: Left Arm, Patient Position: Sitting, Cuff Size: Large)   Pulse 94   Temp 98.6  F (37 C) (Oral)   Ht 5\' 5"  (1.651 m)   Wt 248 lb 3.2 oz (112.6 kg)   SpO2 95%   BMI 41.30 kg/m   BP/Weight 07/09/2016 06/19/2016 10/05/2015  Systolic BP 119 148 134  Diastolic BP 82 98 99  Wt. (Lbs) 248.2 - -  BMI 41.3 - -      Physical Exam  Constitutional: He is oriented to person, place, and time.  Well developed, obese, NAD, polite  HENT:  Head: Normocephalic and atraumatic.  Eyes: No scleral icterus.  Neck: Normal range of motion. Neck supple. No thyromegaly present.  Cardiovascular: Normal rate, regular rhythm and normal heart sounds.   Pulmonary/Chest: Effort normal and breath sounds normal.  Musculoskeletal: He exhibits no edema.  Neurological: He is alert and oriented to person, place, and time. No cranial nerve deficit. Coordination normal.  Skin: Skin is warm and dry. No rash noted. No erythema. No pallor.  Psychiatric: He has a normal mood and affect. His behavior is normal. Thought content normal.  Vitals reviewed.    Assessment & Plan:   1. Elevated BP without diagnosis of hypertension - Comprehensive metabolic panel - CBC with Differential - TSH - Considering ambulatory blood pressure monitor but pt has no insurance or financial assistance. I have recommended that patient fill application for orange card and [employer]   2. Need for Tdap vaccination - Tdap vaccine greater than or equal to 7yo IM  Follow-up: Return in about 2 weeks (around 07/23/2016) for BP check.   Steven Specteroger David Havyn Ramo PA

## 2016-07-09 NOTE — Patient Instructions (Signed)

## 2016-07-10 LAB — CBC WITH DIFFERENTIAL/PLATELET
Basophils Absolute: 0 10*3/uL (ref 0.0–0.2)
Basos: 0 %
EOS (ABSOLUTE): 0.1 10*3/uL (ref 0.0–0.4)
Eos: 1 %
HEMATOCRIT: 44.2 % (ref 37.5–51.0)
HEMOGLOBIN: 14.4 g/dL (ref 13.0–17.7)
IMMATURE GRANULOCYTES: 0 %
Immature Grans (Abs): 0 10*3/uL (ref 0.0–0.1)
Lymphocytes Absolute: 3.4 10*3/uL — ABNORMAL HIGH (ref 0.7–3.1)
Lymphs: 38 %
MCH: 27.8 pg (ref 26.6–33.0)
MCHC: 32.6 g/dL (ref 31.5–35.7)
MCV: 85 fL (ref 79–97)
MONOS ABS: 0.8 10*3/uL (ref 0.1–0.9)
Monocytes: 9 %
NEUTROS PCT: 52 %
Neutrophils Absolute: 4.7 10*3/uL (ref 1.4–7.0)
Platelets: 249 10*3/uL (ref 150–379)
RBC: 5.18 x10E6/uL (ref 4.14–5.80)
RDW: 13.9 % (ref 12.3–15.4)
WBC: 9 10*3/uL (ref 3.4–10.8)

## 2016-07-10 LAB — COMPREHENSIVE METABOLIC PANEL
A/G RATIO: 1.5 (ref 1.2–2.2)
ALK PHOS: 59 IU/L (ref 39–117)
ALT: 39 IU/L (ref 0–44)
AST: 28 IU/L (ref 0–40)
Albumin: 4.9 g/dL (ref 3.5–5.5)
BUN/Creatinine Ratio: 17 (ref 9–20)
BUN: 15 mg/dL (ref 6–20)
Bilirubin Total: <0.2 mg/dL (ref 0.0–1.2)
CO2: 24 mmol/L (ref 20–29)
Calcium: 10 mg/dL (ref 8.7–10.2)
Chloride: 104 mmol/L (ref 96–106)
Creatinine, Ser: 0.89 mg/dL (ref 0.76–1.27)
GFR calc Af Amer: 126 mL/min/{1.73_m2} (ref >59)
GFR calc non Af Amer: 109 mL/min/{1.73_m2} (ref >59)
GLOBULIN, TOTAL: 3.2 g/dL (ref 1.5–4.5)
Glucose: 87 mg/dL (ref 65–99)
POTASSIUM: 4.5 mmol/L (ref 3.5–5.2)
SODIUM: 144 mmol/L (ref 134–144)
Total Protein: 8.1 g/dL (ref 6.0–8.5)

## 2016-07-10 LAB — TSH: TSH: 1.75 u[IU]/mL (ref 0.450–4.500)

## 2016-07-13 ENCOUNTER — Encounter (INDEPENDENT_AMBULATORY_CARE_PROVIDER_SITE_OTHER): Payer: Self-pay

## 2016-07-23 ENCOUNTER — Ambulatory Visit (INDEPENDENT_AMBULATORY_CARE_PROVIDER_SITE_OTHER): Payer: Self-pay | Admitting: Physician Assistant

## 2016-07-23 VITALS — BP 113/72 | HR 79

## 2016-07-23 DIAGNOSIS — Z0130 Encounter for examination of blood pressure without abnormal findings: Secondary | ICD-10-CM

## 2016-07-24 ENCOUNTER — Encounter (INDEPENDENT_AMBULATORY_CARE_PROVIDER_SITE_OTHER): Payer: Self-pay | Admitting: Physician Assistant

## 2016-07-24 NOTE — Progress Notes (Signed)
BP/Weight 07/09/2016 06/19/2016 10/05/2015  Systolic BP 119 148 134  Diastolic BP 82 98 99  Wt. (Lbs) 248.2 - -  BMI 41.3 - -    BP 113/72 during nurse visit today. Patient not currently taking any anti-hypertensives. No further treatment needed.

## 2016-08-14 ENCOUNTER — Ambulatory Visit (INDEPENDENT_AMBULATORY_CARE_PROVIDER_SITE_OTHER): Payer: Self-pay

## 2016-08-28 ENCOUNTER — Ambulatory Visit (INDEPENDENT_AMBULATORY_CARE_PROVIDER_SITE_OTHER): Payer: Self-pay

## 2017-02-03 ENCOUNTER — Ambulatory Visit (HOSPITAL_COMMUNITY)
Admission: EM | Admit: 2017-02-03 | Discharge: 2017-02-03 | Disposition: A | Discharge status: Home / Self Care | Payer: Self-pay | Attending: Family Medicine | Admitting: Family Medicine

## 2017-02-03 ENCOUNTER — Encounter (HOSPITAL_COMMUNITY): Payer: Self-pay | Admitting: Emergency Medicine

## 2017-02-03 DIAGNOSIS — J029 Acute pharyngitis, unspecified: Secondary | ICD-10-CM

## 2017-02-03 DIAGNOSIS — R6889 Other general symptoms and signs: Secondary | ICD-10-CM

## 2017-02-03 NOTE — Discharge Instructions (Signed)

## 2017-02-03 NOTE — ED Provider Notes (Signed)
  [facility]   [id number] 02/03/17 Arrival Time: 1208  ASSESSMENT & PLAN:  1. Flu-like symptoms   2. Sore throat    Discussed typical duration of symptoms. OTC symptom care as needed. Ensure adequate fluid intake and rest. May f/u with PCP or here as needed.  Reviewed expectations re: course of current medical issues. Questions answered. Outlined signs and symptoms indicating need for more acute intervention. Patient verbalized understanding. After Visit Summary given.   SUBJECTIVE: History from: patient.  Steven Houston is a 37 y.o. male who presents with complaint of nasal congestion, post-nasal drainage, and a persistent dry cough. Also with mild sore throat. Onset abrupt, approximately 3 days ago. Overall fatigued with body aches. SOB: none. Wheezing: none. Fever: unsure; chills at times. Overall normal PO intake without n/v. Sick contacts: no. OTC treatment: Alka-Seltzer with mild help.  Received flu shot this year: no.  Social History   Tobacco Use  Smoking Status Never Smoker  Smokeless Tobacco Never Used    ROS: As per HPI.   OBJECTIVE:  Vitals:   02/03/17 1252  BP: 140/73  Pulse: 92  Resp: 20  Temp: 98.1 F (36.7 C)  TempSrc: Oral  SpO2: 97%     General appearance: alert; appears fatigued HEENT: nasal congestion; clear runny nose; throat irritation secondary to post-nasal drainage Neck: supple without LAD Lungs: unlabored respirations, symmetrical air entry; cough: mild; no respiratory distress Skin: warm and dry Psychological: alert and cooperative; normal mood and affect    Allergies  Allergen Reactions  . Haldol [Haloperidol Lactate] Anaphylaxis and Shortness Of Breath  . Amlodipine Other (See Comments)    Nausea    Past Medical History:  Diagnosis Date  . Bipolar disorder (HCC)    History reviewed. No pertinent family history. Social History   Socioeconomic History  . Marital status: Unknown    Spouse name: Not on file    . Number of children: Not on file  . Years of education: Not on file  . Highest education level: Not on file  Social Needs  . Financial resource strain: Not on file  . Food insecurity - worry: Not on file  . Food insecurity - inability: Not on file  . Transportation needs - medical: Not on file  . Transportation needs - non-medical: Not on file  Occupational History  . Not on file  Tobacco Use  . Smoking status: Never Smoker  . Smokeless tobacco: Never Used  Substance and Sexual Activity  . Alcohol use: Not on file  . Drug use: Not on file  . Sexual activity: Not on file  Other Topics Concern  . Not on file  Social History Narrative  . Not on file           Mardella LaymanHagler, Oliviana Mcgahee, MD 02/03/17 1327

## 2017-02-03 NOTE — ED Triage Notes (Signed)
PT C/O: cold sx onset 3 days associated w/sore throat, nasal congestion  DENIES: fevers,   TAKING MEDS: OTC alkaseltzer cold plus   A&O x4... NAD... Ambulatory

## 2017-11-27 ENCOUNTER — Inpatient Hospital Stay (HOSPITAL_COMMUNITY)
Admission: EM | Admit: 2017-11-27 | Discharge: 2017-12-01 | DRG: 193 | Disposition: A | Discharge status: Home / Self Care | Payer: Self-pay | Attending: Internal Medicine | Admitting: Internal Medicine

## 2017-11-27 ENCOUNTER — Other Ambulatory Visit: Payer: Self-pay

## 2017-11-27 ENCOUNTER — Encounter (HOSPITAL_COMMUNITY): Payer: Self-pay | Admitting: Emergency Medicine

## 2017-11-27 ENCOUNTER — Emergency Department (HOSPITAL_COMMUNITY): Payer: Self-pay

## 2017-11-27 DIAGNOSIS — Z8249 Family history of ischemic heart disease and other diseases of the circulatory system: Secondary | ICD-10-CM

## 2017-11-27 DIAGNOSIS — Z8659 Personal history of other mental and behavioral disorders: Secondary | ICD-10-CM

## 2017-11-27 DIAGNOSIS — Z888 Allergy status to other drugs, medicaments and biological substances status: Secondary | ICD-10-CM

## 2017-11-27 DIAGNOSIS — F319 Bipolar disorder, unspecified: Secondary | ICD-10-CM | POA: Diagnosis present

## 2017-11-27 DIAGNOSIS — Z560 Unemployment, unspecified: Secondary | ICD-10-CM

## 2017-11-27 DIAGNOSIS — J9601 Acute respiratory failure with hypoxia: Secondary | ICD-10-CM | POA: Diagnosis present

## 2017-11-27 DIAGNOSIS — R000 Tachycardia, unspecified: Secondary | ICD-10-CM | POA: Diagnosis present

## 2017-11-27 DIAGNOSIS — J189 Pneumonia, unspecified organism: Principal | ICD-10-CM | POA: Diagnosis present

## 2017-11-27 DIAGNOSIS — J181 Lobar pneumonia, unspecified organism: Secondary | ICD-10-CM

## 2017-11-27 DIAGNOSIS — F1411 Cocaine abuse, in remission: Secondary | ICD-10-CM

## 2017-11-27 DIAGNOSIS — I10 Essential (primary) hypertension: Secondary | ICD-10-CM

## 2017-11-27 DIAGNOSIS — Z6841 Body Mass Index (BMI) 40.0 and over, adult: Secondary | ICD-10-CM

## 2017-11-27 LAB — I-STAT CG4 LACTIC ACID, ED: Lactic Acid, Venous: 1.19 mmol/L (ref 0.5–1.9)

## 2017-11-27 LAB — INFLUENZA PANEL BY PCR (TYPE A & B)
INFLBPCR: NEGATIVE
Influenza A By PCR: NEGATIVE

## 2017-11-27 LAB — BASIC METABOLIC PANEL
Anion gap: 11 (ref 5–15)
BUN: 14 mg/dL (ref 6–20)
CALCIUM: 9.5 mg/dL (ref 8.9–10.3)
CHLORIDE: 102 mmol/L (ref 98–111)
CO2: 22 mmol/L (ref 22–32)
CREATININE: 0.87 mg/dL (ref 0.61–1.24)
GFR calc non Af Amer: >60 mL/min (ref >60)
Glucose, Bld: 117 mg/dL — ABNORMAL HIGH (ref 70–99)
Potassium: 4.3 mmol/L (ref 3.5–5.1)
SODIUM: 135 mmol/L (ref 135–145)

## 2017-11-27 LAB — CBC
HCT: 41.8 % (ref 39.0–52.0)
Hemoglobin: 13.4 g/dL (ref 13.0–17.0)
MCH: 27.7 pg (ref 26.0–34.0)
MCHC: 32.1 g/dL (ref 30.0–36.0)
MCV: 86.4 fL (ref 80.0–100.0)
NRBC: 0 % (ref 0.0–0.2)
Platelets: 302 10*3/uL (ref 150–400)
RBC: 4.84 MIL/uL (ref 4.22–5.81)
RDW: 12.5 % (ref 11.5–15.5)
WBC: 14.7 10*3/uL — AB (ref 4.0–10.5)

## 2017-11-27 LAB — I-STAT TROPONIN, ED: Troponin i, poc: 0.01 ng/mL (ref 0.00–0.08)

## 2017-11-27 MED ORDER — SODIUM CHLORIDE 0.9 % IV BOLUS (SEPSIS)
1000.0000 mL | Freq: Once | INTRAVENOUS | Status: AC
  Administered 2017-11-27: 1000 mL via INTRAVENOUS

## 2017-11-27 MED ORDER — IPRATROPIUM-ALBUTEROL 0.5-2.5 (3) MG/3ML IN SOLN
3.0000 mL | Freq: Once | RESPIRATORY_TRACT | Status: AC
  Administered 2017-11-27: 3 mL via RESPIRATORY_TRACT
  Filled 2017-11-27: qty 3

## 2017-11-27 MED ORDER — ONDANSETRON HCL 4 MG/2ML IJ SOLN: 4.0000 mg | Freq: Four times a day (QID) | INTRAMUSCULAR | Status: DC | PRN

## 2017-11-27 MED ORDER — ENOXAPARIN SODIUM 40 MG/0.4ML ~~LOC~~ SOLN
40.0000 mg | SUBCUTANEOUS | Status: DC
  Administered 2017-11-27 – 2017-11-28 (×2): 40 mg via SUBCUTANEOUS
  Filled 2017-11-27 (×3): qty 0.4

## 2017-11-27 MED ORDER — IPRATROPIUM-ALBUTEROL 0.5-2.5 (3) MG/3ML IN SOLN
3.0000 mL | RESPIRATORY_TRACT | Status: DC | PRN
  Administered 2017-11-27 (×2): 3 mL via RESPIRATORY_TRACT
  Filled 2017-11-27 (×3): qty 3

## 2017-11-27 MED ORDER — MORPHINE SULFATE (PF) 4 MG/ML IV SOLN
4.0000 mg | Freq: Once | INTRAVENOUS | Status: AC
  Administered 2017-11-27: 4 mg via INTRAVENOUS
  Filled 2017-11-27: qty 1

## 2017-11-27 MED ORDER — SODIUM CHLORIDE 0.9 % IV SOLN
INTRAVENOUS | Status: AC
  Administered 2017-11-27: 12:00:00 via INTRAVENOUS

## 2017-11-27 MED ORDER — SODIUM CHLORIDE 0.9 % IV BOLUS (SEPSIS)
500.0000 mL | Freq: Once | INTRAVENOUS | Status: AC
  Administered 2017-11-27: 500 mL via INTRAVENOUS

## 2017-11-27 MED ORDER — ONDANSETRON HCL 4 MG PO TABS: 4.0000 mg | ORAL_TABLET | Freq: Four times a day (QID) | ORAL | Status: DC | PRN

## 2017-11-27 MED ORDER — IBUPROFEN 400 MG PO TABS
400.0000 mg | ORAL_TABLET | Freq: Once | ORAL | Status: AC
  Administered 2017-11-27: 400 mg via ORAL
  Filled 2017-11-27: qty 1

## 2017-11-27 MED ORDER — SODIUM CHLORIDE 0.9 % IV SOLN
2.0000 g | INTRAVENOUS | Status: DC
  Administered 2017-11-27 – 2017-11-29 (×3): 2 g via INTRAVENOUS
  Filled 2017-11-27 (×4): qty 20

## 2017-11-27 MED ORDER — ACETAMINOPHEN 325 MG PO TABS
650.0000 mg | ORAL_TABLET | Freq: Four times a day (QID) | ORAL | Status: DC | PRN
  Administered 2017-11-27 – 2017-12-01 (×6): 650 mg via ORAL
  Filled 2017-11-27 (×9): qty 2

## 2017-11-27 MED ORDER — AZITHROMYCIN 500 MG IV SOLR
500.0000 mg | INTRAVENOUS | Status: DC
Start: 2017-11-27 — End: 2017-11-30
  Administered 2017-11-27 – 2017-11-30 (×4): 500 mg via INTRAVENOUS
  Filled 2017-11-27 (×5): qty 500

## 2017-11-27 MED ORDER — ONDANSETRON HCL 4 MG/2ML IJ SOLN
4.0000 mg | Freq: Once | INTRAMUSCULAR | Status: AC
  Administered 2017-11-27: 4 mg via INTRAVENOUS
  Filled 2017-11-27: qty 2

## 2017-11-27 MED ORDER — ACETAMINOPHEN 650 MG RE SUPP: 650.0000 mg | Freq: Four times a day (QID) | RECTAL | Status: DC | PRN

## 2017-11-27 NOTE — Progress Notes (Signed)
[name] w/ Dr Julian HyJamie Houston about pt high BP.  She opted to watch his pressure for now.

## 2017-11-27 NOTE — ED Provider Notes (Signed)
Pt d/w IMTS who will admit.   Steven Houston, Steven Braud, MD 11/27/17 [phone]

## 2017-11-27 NOTE — H&P (Signed)
Date: 11/27/2017               Patient Name:  Steven Houston MRN: [id number]  DOB: 1980-03-20 Age / Sex: 37 y.o., male   PCP: System, Provider Not In         Medical Service: Internal Medicine Teaching Service         Attending Physician: Dr. Cyndie Chime, Genene Churn, MD    First Contact: Dr. Criss Alvine Pager: [phone]  Second Contact: Dr. Caron Presume Pager: [phone]       After Hours (After 5p/  First Contact Pager: [phone]  weekends / holidays): Second Contact Pager: [phone]   Chief Complaint: Progressive cough  History of Present Illness: Steven Houston is a 37 y.o male that presented to the ED with progressive semi-productive cough of 7 days duration. Prior to this he as in good healthy. Approximately 7 days prior to admission he developed a sore throat that progressed to a cough and congestion. The congestion has improved by his cough and diaphoresis has persisted. Initially he felt that things were improving but then became worse again. He continues to have an itchy throat, abdominal pain with coughing, and SOB. Has tried multiple medications without significant relief. He endorses chills but otherwise denies systemic symptoms including fevers, LE edema, sick contact, recent travel. He was previously in jail (2017). Never been told he has TB. Does have a recent exposure to pesticides ~1 month ago.  Never been sick like this in the past but was hospitalized in 2016 after smoking crack cocaine. During that hospitalization he was told he had an issue with his heart but does not recall exactly what has happened.   Meds:  No current facility-administered medications on file prior to encounter.    No current outpatient medications on file prior to encounter.   Allergies: Allergies as of 11/27/2017 - Review Complete 11/27/2017  Allergen Reaction Noted  . Haldol [haloperidol lactate] Anaphylaxis and Shortness Of Breath 06/06/2014  . Amlodipine Other (See Comments) 07/09/2016   Past Medical  History:  Diagnosis Date  . Bipolar disorder (HCC)    Family History:  + HTN  Social History:  Unemployed. 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  Denies the use of tobacco  Prior cocaine use (smoke) and marijuana, last use in 2016  Prior EtOH use in 2016   Review of Systems: A complete ROS was negative except as per HPI.   Physical Exam: Blood pressure (!) 137/115, pulse (!) 117, temperature 98.6 F (37 C), temperature source Oral, resp. rate (!) 22, height 5' 4.5" (1.638 m), weight 113.4 kg, SpO2 95 %.  General: Obese, diaphoretic male in no acute distress HENT: Normocephalic, atraumatic, moist mucus membranes Pulm: Good air movement with faint RLL inspiratory crackles  CV: RRR, no murmurs, no rubs  Abdomen: Active bowel sounds, soft, non-distended, no tenderness to palpation  Extremities: Pulses palpable in all extremities, no LE edema  Skin: Warm and dry  Neuro: Alert and oriented x 3  EKG: personally reviewed my interpretation is sinus tachycardia with nonspecific repolarization abnormalities in the inferior leads.  CXR: personally reviewed my interpretation is patchy right biddle lobe opacities.   Assessment & Plan by Problem: Active Problems:   Community acquired pneumonia  Steven Houston is a 37 y.o male that presented to the ED with progressive semi-productive cough of 7 days duration. In the ED he was found to by tachycardic, tachypneic, and hypoxic. Labs were significant for  a leukocytosis and CXR for a right middle lobe infiltrate. Given his hypoxia he was subsequently admitted for further evaluation and management.   Community Acquired Pneumonia, Low risk for MDR - Code sepsis called in ED. S/p 3.5L of fluid resuscitation  - Influenza swab negative  - Started on Ceftriaxone and Azithromycin, Day #1 of 5  - PRN DuoNebs  - Will not check urine antigens as it will not change treatment  - Follow-up blood cultures. - Continue NS  @100  mL/hr for 15 hours  - Will need outpatient follow-up with PCP and repeat CXR in 6-8 weeks to ensure PNA has cleared.  - Will monitor overnight and likely discharge tomorrow if hypoxia improves.   Diet: Regular  VTE ppx: Lovenox  CODE STATUS: Full code  Dispo: Admit patient to Observation with expected length of stay less than 2 midnights.  Signed: Levora DredgeHelberg, Yamilet Mcfayden, MD 11/27/2017, 11:40 AM  Pager: [phone]

## 2017-11-27 NOTE — ED Notes (Signed)
Patient transported to X-ray 

## 2017-11-27 NOTE — Progress Notes (Signed)
Dr. Nedra HaiLee at bedside to evaluate pts respiration per Herbert SetaHeather, RN.  MD ordered Ibuprofen for headache. Talking in complete sentences.

## 2017-11-27 NOTE — ED Provider Notes (Signed)
[facility] Provider Note   CSN: [id number] Arrival date & time: 11/27/17  0408     History   Chief Complaint Chief Complaint  Patient presents with  . Shortness of Breath  . Abdominal Pain    HPI Steven Houston is a 37 y.o. male.  The history is provided by the patient.  Shortness of Breath  This is a new problem. The average episode lasts 1 week. The problem occurs frequently.The current episode started more than 2 days ago. The problem has been gradually worsening. Associated symptoms include sore throat, cough, chest pain and abdominal pain. Pertinent negatives include no sputum production, no hemoptysis, no vomiting and no leg swelling. Treatments tried: OTC meds. The treatment provided no relief. Associated medical issues do not include chronic lung disease.  Abdominal Pain   Associated symptoms include myalgias. Pertinent negatives include diarrhea and vomiting.  Patient reports a week ago he began having cough/sore throat/body aches.  He reports sweats and fevers.   CP with cough only.  No hemoptysis.  No recent travel.  He has tried OTC meds without relief Denies smoking/vaping  Past Medical History:  Diagnosis Date  . Bipolar disorder [facility](HCC)     Patient Active Problem List   Diagnosis Date Noted  . Acute psychosis (HCC) 06/07/2014    History reviewed. No pertinent surgical history.      Home Medications    Prior to Admission medications   Not on File    Family History No family history on file.  Social History Social History   Tobacco Use  . Smoking status: Never Smoker  . Smokeless tobacco: Never Used  Substance Use Topics  . Alcohol use: Not on file  . Drug use: Not on file     Allergies   Haldol [haloperidol lactate] and Amlodipine   Review of Systems Review of Systems  Constitutional: Positive for chills and fatigue.  HENT: Positive for sore throat.   Respiratory: Positive for cough and  shortness of breath. Negative for hemoptysis and sputum production.   Cardiovascular: Positive for chest pain. Negative for leg swelling.       CP with cough   Gastrointestinal: Positive for abdominal pain. Negative for diarrhea and vomiting.  Musculoskeletal: Positive for myalgias.  Neurological: Positive for weakness.  All other systems reviewed and are negative.    Physical Exam Updated Vital Signs BP 138/86   Pulse (!) 122   Temp 99 F (37.2 C) (Oral)   Resp (!) 24   Ht 1.638 m (5' 4.5")   Wt 113.4 kg   SpO2 95%   BMI 42.25 kg/m   Physical Exam  CONSTITUTIONAL: Well developed/well nourished HEAD: Normocephalic/atraumatic EYES: EOMI/PERRL ENMT: Mucous membranes moist uvula midline without erythema or exudates, no stridor, normal phonation NECK: supple no meningeal signs SPINE/BACK:entire spine nontender CV: S1/S2 noted, tachycardic LUNGS: Tachypnea, coarse breath sounds bilaterally ABDOMEN: soft, nontender, no rebound or guarding, bowel sounds noted throughout abdomen GU:no cva tenderness NEURO: Pt is awake/alert/appropriate, moves all extremitiesx4.  No facial droop.   EXTREMITIES: pulses normal/equal, full ROM, no lower extremity edema or tenderness SKIN: warm, color normal PSYCH: no abnormalities of mood noted, alert and oriented to situation  ED Treatments / Results  Labs (all labs ordered are listed, but only abnormal results are displayed) Labs Reviewed  BASIC METABOLIC PANEL - Abnormal; Notable for the following components:      Result Value   Glucose, Bld 117 (*)    All other  components within normal limits  CBC - Abnormal; Notable for the following components:   WBC 14.7 (*)    All other components within normal limits  CULTURE, BLOOD (ROUTINE X 2)  CULTURE, BLOOD (ROUTINE X 2)  INFLUENZA PANEL BY PCR (TYPE A & B)  I-STAT TROPONIN, ED  I-STAT CG4 LACTIC ACID, ED    EKG EKG Interpretation  Date/Time:  Saturday November 27 2017 04:19:18  EST Ventricular Rate:  123 PR Interval:    QRS Duration: 84 QT Interval:  288 QTC Calculation: 412 R Axis:   68 Text Interpretation:  Sinus tachycardia Anteroseptal infarct, old Nonspecific repol abnormality, inferior leads Borderline ST elevation, lateral leads Abnormal ekg No significant change since last tracing Confirmed by Zadie Rhine ([id number]) on 11/27/2017 4:26:29 AM   Radiology Dg Chest 2 View  Result Date: 11/27/2017 CLINICAL DATA:  Feeling unwell, dry cough. EXAM: CHEST - 2 VIEW COMPARISON:  Chest radiograph November 13, 2014 FINDINGS: Cardiac silhouette is upper limits of normal size. Mediastinal silhouette is not suspicious. Bronchitic changes and patchy RIGHT middle lobe airspace opacity. No pleural effusion or pneumothorax. Soft tissue planes and included osseous structures are non suspicious. IMPRESSION: 1. RIGHT middle lobe bronchopneumonia. 2. Borderline cardiomegaly. Electronically Signed   By: Awilda Metro M.D.   On: 11/27/2017 05:23    Procedures Procedures  CRITICAL CARE Performed by: Joya Gaskins Total critical care time: 35 minutes Critical care time was exclusive of separately billable procedures and treating other patients. Critical care was necessary to treat or prevent imminent or life-threatening deterioration. Critical care was time spent personally by me on the following activities: development of treatment plan with patient and/or surrogate as well as nursing, discussions with consultants, evaluation of patient's response to treatment, examination of patient, obtaining history from patient or surrogate, ordering and performing treatments and interventions, ordering and review of laboratory studies, ordering and review of radiographic studies, pulse oximetry and re-evaluation of patient's condition. Patient with tachycardia, tachypnea and mild hypoxia required admission for pneumonia  Medications Ordered in ED Medications  sodium chloride 0.9 %  bolus 1,000 mL (1,000 mLs Intravenous New Bag/Given 11/27/17 0629)    And  sodium chloride 0.9 % bolus 1,000 mL (0 mLs Intravenous Stopped 11/27/17 0633)    And  sodium chloride 0.9 % bolus 1,000 mL (0 mLs Intravenous Stopped 11/27/17 0623)    And  sodium chloride 0.9 % bolus 500 mL (has no administration in time range)  cefTRIAXone (ROCEPHIN) 2 g in sodium chloride 0.9 % 100 mL IVPB (0 g Intravenous Stopped 11/27/17 0638)  azithromycin (ZITHROMAX) 500 mg in sodium chloride 0.9 % 250 mL IVPB (500 mg Intravenous New Bag/Given 11/27/17 0641)  ibuprofen (ADVIL,MOTRIN) tablet 400 mg (400 mg Oral Given 11/27/17 0436)  ipratropium-albuterol (DUONEB) 0.5-2.5 (3) MG/3ML nebulizer solution 3 mL (3 mLs Nebulization Given 11/27/17 0456)     Initial Impression / Assessment and Plan / ED Course  I have reviewed the triage vital signs and the nursing notes.  Pertinent labs & imaging results that were available during my care of the patient were reviewed by me and considered in my medical decision making (see chart for details).     PT presented with cough and shortness of breath for a week.  He appeared ill on initial evaluation, he was tachycardic, tachypneic and hypoxic.  Chest x-ray reviewed and revealed pneumonia. Code sepsis was called due to abnormal vital signs.  Patient was given IV fluids as well as IV antibiotics.  He is now beginning to improve. BP (!) 146/107   Pulse (!) 108   Temp 98.7 F (37.1 C) (Oral)   Resp (!) 27   Ht 1.638 m (5' 4.5")   Wt 113.4 kg   SpO2 95%   BMI 42.25 kg/m   Signed out to dr Particia Nearing to call for admission  Final Clinical Impressions(s) / ED Diagnoses   Final diagnoses:  Community acquired pneumonia of right middle lobe of lung [facility])    ED Discharge Orders    None       Zadie Rhine, MD 11/27/17 [phone]

## 2017-11-27 NOTE — ED Triage Notes (Signed)
Pt has been feeling poorly for a week, dry cough, chest pain when he coughs.  Is currently diaphoretic and sob in room, reports weakness as well. OTC medications have not relieved his symptoms.

## 2017-11-28 ENCOUNTER — Inpatient Hospital Stay (HOSPITAL_COMMUNITY): Payer: Self-pay

## 2017-11-28 DIAGNOSIS — R000 Tachycardia, unspecified: Secondary | ICD-10-CM

## 2017-11-28 DIAGNOSIS — J189 Pneumonia, unspecified organism: Secondary | ICD-10-CM | POA: Diagnosis present

## 2017-11-28 LAB — CBC
HEMATOCRIT: 39.9 % (ref 39.0–52.0)
Hemoglobin: 13.2 g/dL (ref 13.0–17.0)
MCH: 28.8 pg (ref 26.0–34.0)
MCHC: 33.1 g/dL (ref 30.0–36.0)
MCV: 87.1 fL (ref 80.0–100.0)
Platelets: 296 10*3/uL (ref 150–400)
RBC: 4.58 MIL/uL (ref 4.22–5.81)
RDW: 12.8 % (ref 11.5–15.5)
WBC: 15.3 10*3/uL — AB (ref 4.0–10.5)
nRBC: 0 % (ref 0.0–0.2)

## 2017-11-28 LAB — BASIC METABOLIC PANEL
Anion gap: 10 (ref 5–15)
BUN: 7 mg/dL (ref 6–20)
CALCIUM: 9 mg/dL (ref 8.9–10.3)
CHLORIDE: 105 mmol/L (ref 98–111)
CO2: 25 mmol/L (ref 22–32)
Creatinine, Ser: 0.91 mg/dL (ref 0.61–1.24)
GFR calc non Af Amer: >60 mL/min (ref >60)
Glucose, Bld: 114 mg/dL — ABNORMAL HIGH (ref 70–99)
Potassium: 4.1 mmol/L (ref 3.5–5.1)
SODIUM: 140 mmol/L (ref 135–145)

## 2017-11-28 LAB — HIV ANTIBODY (ROUTINE TESTING W REFLEX): HIV SCREEN 4TH GENERATION: NONREACTIVE

## 2017-11-28 LAB — TSH: TSH: 0.822 u[IU]/mL (ref 0.350–4.500)

## 2017-11-28 LAB — T4, FREE: Free T4: 1.06 ng/dL (ref 0.82–1.77)

## 2017-11-28 MED ORDER — SODIUM CHLORIDE 0.9 % IV SOLN
INTRAVENOUS | Status: DC
  Administered 2017-11-28: 06:00:00 via INTRAVENOUS

## 2017-11-28 MED ORDER — AMOXICILLIN 875 MG PO TABS: 875.0000 mg | ORAL_TABLET | Freq: Two times a day (BID) | ORAL | 0 refills | Status: DC

## 2017-11-28 MED ORDER — ENOXAPARIN SODIUM 120 MG/0.8ML ~~LOC~~ SOLN
1.0000 mg/kg | Freq: Once | SUBCUTANEOUS | Status: AC
  Administered 2017-11-28: 115 mg via SUBCUTANEOUS
  Filled 2017-11-28: qty 0.8

## 2017-11-28 MED ORDER — AZITHROMYCIN 500 MG PO TABS: 500.0000 mg | ORAL_TABLET | Freq: Every day | ORAL | 0 refills | Status: DC

## 2017-11-28 MED ORDER — IPRATROPIUM-ALBUTEROL 0.5-2.5 (3) MG/3ML IN SOLN
3.0000 mL | Freq: Four times a day (QID) | RESPIRATORY_TRACT | Status: DC
  Administered 2017-11-28: 3 mL via RESPIRATORY_TRACT
  Filled 2017-11-28 (×2): qty 3

## 2017-11-28 MED ORDER — IOPAMIDOL (ISOVUE-370) INJECTION 76%
100.0000 mL | Freq: Once | INTRAVENOUS | Status: AC | PRN
  Administered 2017-11-28: 100 mL via INTRAVENOUS

## 2017-11-28 MED ORDER — IBUPROFEN 400 MG PO TABS
600.0000 mg | ORAL_TABLET | Freq: Once | ORAL | Status: AC
  Administered 2017-11-28: 600 mg via ORAL
  Filled 2017-11-28: qty 1

## 2017-11-28 MED ORDER — SALINE SPRAY 0.65 % NA SOLN
1.0000 | NASAL | Status: DC | PRN
  Administered 2017-11-28: 1 via NASAL
  Filled 2017-11-28: qty 44

## 2017-11-28 MED ORDER — IBUPROFEN 400 MG PO TABS
400.0000 mg | ORAL_TABLET | Freq: Once | ORAL | Status: AC
  Administered 2017-11-28: 400 mg via ORAL
  Filled 2017-11-28: qty 1

## 2017-11-28 MED ORDER — IBUPROFEN 200 MG PO TABS
400.0000 mg | ORAL_TABLET | Freq: Four times a day (QID) | ORAL | Status: DC | PRN
  Administered 2017-11-28 – 2017-12-01 (×8): 400 mg via ORAL
  Filled 2017-11-28 (×10): qty 2

## 2017-11-28 MED ORDER — LEVALBUTEROL HCL 0.63 MG/3ML IN NEBU
0.6300 mg | INHALATION_SOLUTION | Freq: Once | RESPIRATORY_TRACT | Status: AC
  Administered 2017-11-28: 0.63 mg via RESPIRATORY_TRACT
  Filled 2017-11-28: qty 3

## 2017-11-28 NOTE — Progress Notes (Signed)
   Subjective: [name] reports significant improvement in his shortness of breath and cough today.  He also states that he has stopped having diaphoresis denies any fevers/chills.  Objective:  Vital signs in last 24 hours: Vitals:   11/27/17 1224 11/27/17 1710 11/27/17 1914 11/27/17 1922  BP:  (!) 179/124 (!) 161/91   Pulse: (!) 124 (!) 116 (!) 123   Resp:  (!) 40 (!) 45 (!) 32  Temp:  98.5 F (36.9 C)    TempSrc:  Oral    SpO2: 95% 98% 92%   Weight:      Height:       Physical Exam  Constitutional: He is oriented to person, place, and time. He appears well-developed and well-nourished.  HENT:  Head: Normocephalic and atraumatic.  Eyes: EOM are normal.  Neck: Normal range of motion.  Cardiovascular: Regular rhythm and normal heart sounds.  Tachycardic with a heart rate from 115-120s.  Pulmonary/Chest:  Wheezing heard throughout both lungs.  Abdominal: Soft. Bowel sounds are normal.  Musculoskeletal:       Right lower leg: He exhibits no tenderness and no edema.       Left lower leg: He exhibits no tenderness and no edema.  Neurological: He is alert and oriented to person, place, and time.  Skin: Skin is warm and dry.  Psychiatric: He has a normal mood and affect. His behavior is normal.  Nursing note and vitals reviewed.   Assessment/Plan:  Active Problems:   Community acquired pneumonia  Mr. Philipp OvensMichael Oliff was admitted for community-acquired pneumonia and treated with ceftriaxone and azithromycin.  His cough and shortness of breath have greatly improved.  Additionally his leukocytosis has down trended to 15 (yesterday 19). Blood cultures have remained no growth to date for 24 hours. He does continue to be tachycardic into the early 110s-120s.  His tachycardia may be due to his underlying pneumonia but I will check a TSH/free T4 to evaluate for thyroid abnormalities.  I will also order a levalbuterol nebulizer treatment today.  If he continues to do well throughout the day I  will discharge him with oral antibiotics later this afternoon.  Community Acquired Pneumonia: 1.  Completed 2 days of ceftriaxone and azithromycin.  We will discharge him with oral amoxicillin 875 mg twice daily and azithromycin 500 mg daily for an additional 5 days (total 7-day course). 2.  Ordered levalbuterol nebulizer treatment  Tachycardia: 1.  Ordered TSH/free T4  Dispo: Anticipated discharge today.  Synetta ShadowPrince,  M, MD 11/28/2017, 6:05 AM Pager: [phone]

## 2017-11-28 NOTE — Progress Notes (Signed)
Called to patient's bedside due to persistent tachypnea and hypoxia on room air.  [name] reports that his shortness of breath and cough worsened throughout the day today.  He states that he was told he had damage to his heart from previous crack cocaine use.  He also states that he has felt short of breath for the past year prior to admission and has gained over 80 pounds during this time.  [facility] but results are unavailable on care everywhere.  CXR reviewed: Right middle lobe infiltrate and peribronchial cuffing   Upon arrival, patient was saturating well (98-100%) on 15 L non-rebreather. Decreased to 7L non-rebreather while in room with normal oxygen saturations.   Vitals:   11/28/17 0811 11/28/17 1230  BP: (!) 161/121 (!) 161/105  Pulse:  (!) 128  Resp:  (!) 42  Temp:  98.6 F (37 C)  SpO2:  (!) 81%   Physical Exam: General: Sitting up in bed with Neb treatment mask on.  CV: Tachycardic with normal heart sounds Resp: Tachypneic. Expiratory wheezes throughout all lung fields unchanged from this morning's exam.  No crackles or rales appreciated. MSK: No edema or tenderness to palpation of lower extremities bilaterally.  Plan: 1. Schedule DuoNeb treatments for every 6 hours  2. No indication for CT scan given that 11/16 CXR (yesterday) does not show volume loss suggesting that there is no damage to the parenchyma. His diffuse wheezing is likely secondary to inflammation of the bronchioles as demonstrated by peribronchial cuffing on 11/16 CXR. 3. Wean oxygen to maintain sat >88% 4. Although patient's blood pressure is above goal of 130/80 he likely has chronic hypertension. Per chart review, he was previously on HCTZ and lisinopril. Will defer treatment to outpatient PCP. If SBP >180 or DBP >110, will treat. 5.  Will [facility] to get [facility].

## 2017-11-28 NOTE — Progress Notes (Signed)
Patient consistently not doing well. O2 =81 on room air.  Tachypnea 42 on room air.  Pulse 128.  BP 161/105.  Have paged treatment team w/ concerns about high bp.  Dr. Criss AlvinePrince.  Not receiving any interventions.

## 2017-11-28 NOTE — Discharge Instructions (Signed)

## 2017-11-28 NOTE — Progress Notes (Signed)
11/28/2017 Patient transfer from [facility] to 2W at 1424. He is alert, oriented and ambulatory. Patient had shortness of breath when he arrive on the unit and heart rate in the 125 to 130. Patient level of care was med Surg. RN notified Internal Medicine concerning heart rate a 12 lead EKG was ordered and it showed patient had tachycardia and vent rate 121. Patient was place on Telemetry [facility] ANadine Dayle Sherpa RN.

## 2017-11-28 NOTE — Discharge Summary (Signed)
Name: Steven Houston MRN: [id number] DOB: 29-Nov-1980 37 y.o. PCP: System, Provider Not In  Date of Admission: 11/27/2017  4:09 AM Date of Discharge: 12/01/2017 Attending Physician: No att. providers found  Discharge Diagnosis: 1. Community Acquired Pneumonia 2. Hypertension  Discharge Medications: Allergies as of 12/01/2017      Reactions   Haldol [haloperidol Lactate] Anaphylaxis, Shortness Of Breath   Amlodipine Other (See Comments)   Nausea      Medication List    TAKE these medications   amoxicillin-clavulanate 875-125 MG tablet Commonly known as:  AUGMENTIN Take 1 tablet by mouth 2 (two) times daily for 5 days.   benzonatate 200 MG capsule Commonly known as:  TESSALON Take 1 capsule (200 mg total) by mouth 2 (two) times daily as needed for cough.   chlorpheniramine-HYDROcodone 10-8 MG/5ML Suer Commonly known as:  TUSSIONEX Take 5 mLs by mouth every 12 (twelve) hours as needed for cough.   lisinopril-hydrochlorothiazide 10-12.5 MG tablet Commonly known as:  PRINZIDE,ZESTORETIC Take 1 tablet by mouth daily.     Disposition and follow-up:   Steven Houston was discharged from [facility] in Good condition.  At the hospital follow up visit please address:  1.  Please follow-up on [name] respiratory status during his follow-up appointment tomorrow.  He will also need a follow-up chest x-ray within 4 to 6 weeks.  2.  Please check his blood pressure medication on his new lisinopril-hydrochlorothiazide medication.  3.  Labs / imaging needed at time of follow-up: Chest x-ray in 4 to 6 weeks  4.  Pending labs/ test needing follow-up: None  Follow-up Appointments: Follow-up Information    Theora Gianotti, NP Follow up on 12/02/2017.   Specialty:  Family Medicine Why:  9;30 am for hospital follow up Contact information: [address] [place] [address] [phone]         Follow-up appointment scheduled with Raynald Blend  tomorrow Thursday, November 21 at 9:30 AM.  Hospital Course by problem list: 1.  Community-acquired pneumonia: Steven Houston is a 37 year old male with a non-significant past medical history who presented to the emergency department with signs and symptoms of community acquired pneumonia. He was found to be acutely hypoxic and subsequently admitted for further evaluation and management. Initially he was started on ceftriaxone and azithromycin with clinical improvement; however, after approximately one day of being hospitalized his condition acutely deteriorated. He remained persistently tachycardic and hypoxic on 4 L per minute nasal cannula. CTA was pursued to rule out an acute PE. No pulmonary emboli was identified however it did illustrate diffuse multifocal pneumonia. He was subsequently switch to Unasyn and azithromycin. Over the course of his hospitalization he significantly improved and on discharge was not requiring supplemental oxygen. His antibiotics were switched Augmentin one tablet BID for five more days (7 days total of Abx). He will follow-up with his primary care provider on 12/02/2017.  2. Hypertension. The patient remained hypertensive throughout his hospitalization with systolic blood pressures between 160-180 and diastolic pressures between 100-120. He was subsequently started on lisinopril and chlorthalidone with improvement. On discharge he was transition to a once daily pill of lisinopril - hydrochlorothiazide. He will likely need up titration of his blood pressure medications.  Discharge Vitals:   BP (!) 142/93 (BP Location: Right Arm)   Pulse (!) 111   Temp 98.7 F (37.1 C) (Oral)   Resp 16   Ht 5' 4.5" (1.638 m)   Wt 113.4 kg   SpO2 Marland Kitchen)  78%   BMI 42.25 kg/m   Pertinent Labs, Studies, and Procedures:   CTA No evidence of pulmonary embolus.  Bilateral airspace disease, right greater than left compatible with multifocal pneumonia.  Reactive right hilar and  mediastinal lymph nodes.  Discharge Instructions: Discharge Instructions    Call MD for:  difficulty breathing, headache or visual disturbances   Complete by:  As directed    Call MD for:  persistant dizziness or light-headedness   Complete by:  As directed    Call MD for:  temperature >100.4   Complete by:  As directed    Diet - low sodium heart healthy   Complete by:  As directed    Discharge instructions   Complete by:  As directed    Thank you so much for allowing us to take care of you.  You were diagnosed with a pneumonia and was given antibiotics in the hospital which seemed to help alot.  You will need to take 5 more days (1 tonight and then 1 twice a day for 4 more days) of the oral antibiotic Augmentin.  I have also sent in a prescription for a blood pressure medication called Lisinopril-Hydrochlorothiazide. Please take one pill once a day. This is really important to control your blood pressure.   You have a follow-up appointment with your PCP tomorrow.   Increase activity slowly   Complete by:  As directed      Signed: Levora DredgeHelberg, Terria Deschepper, MD 12/01/2017, 6:27 PM

## 2017-11-29 DIAGNOSIS — J189 Pneumonia, unspecified organism: Principal | ICD-10-CM

## 2017-11-29 DIAGNOSIS — I10 Essential (primary) hypertension: Secondary | ICD-10-CM

## 2017-11-29 LAB — CBC
HEMATOCRIT: 40.4 % (ref 39.0–52.0)
Hemoglobin: 12.6 g/dL — ABNORMAL LOW (ref 13.0–17.0)
MCH: 26.1 pg (ref 26.0–34.0)
MCHC: 31.2 g/dL (ref 30.0–36.0)
MCV: 83.8 fL (ref 80.0–100.0)
NRBC: 0 % (ref 0.0–0.2)
PLATELETS: 298 10*3/uL (ref 150–400)
RBC: 4.82 MIL/uL (ref 4.22–5.81)
RDW: 12.7 % (ref 11.5–15.5)
WBC: 18.5 10*3/uL — AB (ref 4.0–10.5)

## 2017-11-29 LAB — CBC WITH DIFFERENTIAL/PLATELET
Basophils Absolute: 0 10*3/uL (ref 0.0–0.1)
Basophils Relative: 0 %
Eosinophils Absolute: 0.1 10*3/uL (ref 0.0–0.5)
Eosinophils Relative: 1 %
HCT: 40.1 % (ref 39.0–52.0)
HEMOGLOBIN: 12.7 g/dL — AB (ref 13.0–17.0)
LYMPHS ABS: 3.1 10*3/uL (ref 0.7–4.0)
Lymphocytes Relative: 17 %
MCH: 26.6 pg (ref 26.0–34.0)
MCHC: 31.7 g/dL (ref 30.0–36.0)
MCV: 84.1 fL (ref 80.0–100.0)
MONOS PCT: 8 %
Monocytes Absolute: 1.5 10*3/uL — ABNORMAL HIGH (ref 0.1–1.0)
NEUTROS PCT: 74 %
Neutro Abs: 13 10*3/uL — ABNORMAL HIGH (ref 1.7–7.7)
Platelets: 283 10*3/uL (ref 150–400)
RBC: 4.77 MIL/uL (ref 4.22–5.81)
RDW: 12.8 % (ref 11.5–15.5)
WBC: 17.9 10*3/uL — ABNORMAL HIGH (ref 4.0–10.5)
nRBC: 0 % (ref 0.0–0.2)

## 2017-11-29 MED ORDER — LACTATED RINGERS IV SOLN
INTRAVENOUS | Status: AC
  Administered 2017-11-29: 15:00:00 via INTRAVENOUS

## 2017-11-29 MED ORDER — LISINOPRIL 10 MG PO TABS: 10.0000 mg | ORAL_TABLET | Freq: Every day | ORAL | Status: DC

## 2017-11-29 MED ORDER — SODIUM CHLORIDE 0.9 % IV SOLN
3.0000 g | Freq: Four times a day (QID) | INTRAVENOUS | Status: DC
  Administered 2017-11-29 – 2017-12-01 (×8): 3 g via INTRAVENOUS
  Filled 2017-11-29 (×10): qty 3

## 2017-11-29 MED ORDER — LISINOPRIL 10 MG PO TABS
10.0000 mg | ORAL_TABLET | Freq: Every day | ORAL | Status: DC
  Administered 2017-11-29 – 2017-12-01 (×3): 10 mg via ORAL
  Filled 2017-11-29 (×3): qty 1

## 2017-11-29 MED ORDER — GUAIFENESIN-DM 100-10 MG/5ML PO SYRP
5.0000 mL | ORAL_SOLUTION | ORAL | Status: DC | PRN
  Administered 2017-11-29 – 2017-12-01 (×5): 5 mL via ORAL
  Filled 2017-11-29 (×5): qty 5

## 2017-11-29 MED ORDER — CHLORTHALIDONE 50 MG PO TABS
50.0000 mg | ORAL_TABLET | Freq: Every day | ORAL | Status: DC
  Administered 2017-11-29 – 2017-12-01 (×3): 50 mg via ORAL
  Filled 2017-11-29 (×3): qty 1

## 2017-11-29 MED ORDER — IPRATROPIUM-ALBUTEROL 0.5-2.5 (3) MG/3ML IN SOLN: 3.0000 mL | Freq: Four times a day (QID) | RESPIRATORY_TRACT | Status: DC | PRN

## 2017-11-29 NOTE — Progress Notes (Signed)
   Subjective: Patient is not feeling well this AM. He is still very diaphoretic, SOB, and has a persistent cough. He does not feel any better than prior. We discussed the results of his CTA and that we would be transitioning antibiotic regimens today to cover various organisms. We will continue breathing treatments. Discussed adding a medication for his BP. All questions and concerns addressed.   Objective: Vital signs in last 24 hours: Vitals:   11/28/17 2243 11/29/17 0614 11/29/17 0802 11/29/17 0804  BP: (!) 152/120 (!) 158/121 (!) 181/110 (!) 153/108  Pulse: (!) 120  (!) 107 (!) 114  Resp: (!) 22  (!) 32   Temp: 98.4 F (36.9 C)  98 F (36.7 C)   TempSrc: Oral  Oral   SpO2: 90%  (!) 87% 93%  Weight:      Height:       General: Obese male in no acute distress Pulm: Good air movement with diffuse wheezing but is improved compared to prior exam CV: Tachycardia, no murmurs, no rubs  Abdomen: Soft, non-distended, no tenderness to palpation  Extremities: No LE edema   Assessment/Plan:  [name] was admitted for community-acquired pneumonia and treated with ceftriaxone and azithromycin.  He is still hypoxic, tachcyardic, and has an increasing leukocytosis. Blood cultures remain with NGTD.   Community Acquired Pneumonia: 1.  Given lack of clinical improvement we will broaden antibiotic coverage to Unasyn and Azithromycin  2. Continued schedule DuoNebs  3. Check CBC with diff  4. Wean oxygen to maintain oxygen saturations >88%  Hypertension  1. Start Lisinopril 10 mg QD  Dispo: Anticipated discharge in approximately 1-2 day(s) pending improvement in oxygenation and clinical improvement.   Levora DredgeHelberg, Steven Lowrey, MD 11/29/2017, 11:28 AM

## 2017-11-29 NOTE — Progress Notes (Signed)
Pharmacy Antibiotic Note  Steven OvensMichael Houston is a 37 y.o. male admitted on 11/27/2017 with pneumonia.  Pharmacy has been consulted for Unasyn dosing. He is on day 3 of antibiotics. Ceftriaxone is being changed to Unasyn. Renal function is stable.  Plan: Unasyn 3g IV q6h Monitor renal function  Follow available micro data  Height: 5' 4.5" (163.8 cm) Weight: 250 lb (113.4 kg) IBW/kg (Calculated) : 60.35  Temp (24hrs), Avg:98.3 F (36.8 C), Min:98 F (36.7 C), Max:98.6 F (37 C)  Recent Labs  Lab 11/27/17 0427 11/27/17 0606 11/28/17 0412  WBC 14.7*  --  15.3*  CREATININE 0.87  --  0.91  LATICACIDVEN  --  1.19  --     Estimated Creatinine Clearance: 125.8 mL/min (by C-G formula based on SCr of 0.91 mg/dL).    Allergies  Allergen Reactions  . Haldol [Haloperidol Lactate] Anaphylaxis and Shortness Of Breath  . Amlodipine Other (See Comments)    Nausea    Antimicrobials this admission: Ceftriaxone 11/16 >> 11/18 Azith 11/16 >> Unasyn 11/18 >>  11/16 Blood x 2 >>  Thank you for allowing pharmacy to be a part of this patient's care.  Estella HuskMichelle Arcenio Mullaly, Pharm.D., BCPS, BCIDP Clinical Pharmacist Phone: [phone] Please check AMION for all [facility] numbers 11/29/2017, 8:41 AM

## 2017-11-30 LAB — CBC WITH DIFFERENTIAL/PLATELET
ABS IMMATURE GRANULOCYTES: 0.11 10*3/uL — AB (ref 0.00–0.07)
BASOS PCT: 0 %
Basophils Absolute: 0 10*3/uL (ref 0.0–0.1)
EOS ABS: 0.1 10*3/uL (ref 0.0–0.5)
Eosinophils Relative: 1 %
HCT: 39.1 % (ref 39.0–52.0)
Hemoglobin: 13.3 g/dL (ref 13.0–17.0)
IMMATURE GRANULOCYTES: 1 %
Lymphocytes Relative: 20 %
Lymphs Abs: 2.7 10*3/uL (ref 0.7–4.0)
MCH: 30.2 pg (ref 26.0–34.0)
MCHC: 34 g/dL (ref 30.0–36.0)
MCV: 88.9 fL (ref 80.0–100.0)
MONOS PCT: 10 %
Monocytes Absolute: 1.4 10*3/uL — ABNORMAL HIGH (ref 0.1–1.0)
NEUTROS ABS: 9.1 10*3/uL — AB (ref 1.7–7.7)
NEUTROS PCT: 68 %
NRBC: 0 % (ref 0.0–0.2)
Platelets: 280 10*3/uL (ref 150–400)
RBC: 4.4 MIL/uL (ref 4.22–5.81)
RDW: 15.4 % (ref 11.5–15.5)
WBC: 13.4 10*3/uL — ABNORMAL HIGH (ref 4.0–10.5)

## 2017-11-30 LAB — BASIC METABOLIC PANEL
Anion gap: 10 (ref 5–15)
BUN: 7 mg/dL (ref 6–20)
CHLORIDE: 104 mmol/L (ref 98–111)
CO2: 24 mmol/L (ref 22–32)
CREATININE: 0.82 mg/dL (ref 0.61–1.24)
Calcium: 9.3 mg/dL (ref 8.9–10.3)
GFR calc Af Amer: >60 mL/min (ref >60)
GFR calc non Af Amer: >60 mL/min (ref >60)
GLUCOSE: 121 mg/dL — AB (ref 70–99)
Potassium: 3.6 mmol/L (ref 3.5–5.1)
Sodium: 138 mmol/L (ref 135–145)

## 2017-11-30 MED ORDER — HYDROCHLOROTHIAZIDE 12.5 MG PO CAPS: 12.5000 mg | ORAL_CAPSULE | Freq: Every day | ORAL | Status: DC

## 2017-11-30 NOTE — Care Management Note (Addendum)
Case Management Note  Patient Details  Name: Philipp OvensMichael Shealy MRN: [id number] Date of Birth: [birth year]  Subjective/Objective:  [facility], pta indep,  he states at the graduate house they do not help with medications, NCM will ast with Match letter which was given to patient. He states his PCP is Jonah BlueSonja White [facility] at [facility]  [phone].                   Action/Plan: DC when ready.   Expected Discharge Date:  11/28/17               Expected Discharge Plan:  Home/Self Care  In-House Referral:     Discharge planning Services  [facility] Consult, [facility]  Post Acute Care Choice:    Choice offered to:     DME Arranged:    DME Agency:     HH Arranged:    HH Agency:     Status of Service:  Completed, signed off  If discussed at [employer] Meetings, dates discussed:    Additional Comments:  Leone Havenaylor, Emanii Bugbee Clinton, RN 11/30/2017, 9:57 AM

## 2017-11-30 NOTE — Progress Notes (Addendum)
   Subjective: Mr. Cristela FeltDuff reports that his shortness of breath and diaphoresis has improved since yesterday.  He does state that whenever he removes his oxygen his " oxygen percentage decreases."  He denies that he becomes more short of breath while off of supplemental oxygen.  He states that he continues to have intermittent coughing spells which exacerbate his headache.  He does state that Tylenol and ibuprofen is helping with his headache.  Robitussin however is not alleviating his cough.    Objective:  Vital signs in last 24 hours: Vitals:   11/29/17 1554 11/29/17 1556 11/30/17 0026 11/30/17 0600  BP: (!) 173/117 (!) 165/124 (!) 170/102   Pulse: (!) 124 (!) 124 (!) 114   Resp: (!) 30 (!) 32    Temp: 98.3 F (36.8 C)  98.9 F (37.2 C)   TempSrc: Oral  Oral   SpO2: 92% 93% 92% 90%  Weight:      Height:       Physical Exam  Constitutional: He is oriented to person, place, and time.  Obese male sitting up in chair in no acute distress.  HENT:  Head: Normocephalic and atraumatic.  Eyes: EOM are normal.  Neck: Normal range of motion.  Cardiovascular:  Tachycardic into 110s to 120s.  Regular rhythm with normal heart sounds.  Pulmonary/Chest:  Scattered expiratory wheezes throughout both lung fields.  Improved from yesterday's exam.  No crackles or rales.  Abdominal: Soft. Bowel sounds are normal. He exhibits no distension. There is no tenderness.  Musculoskeletal:       Right lower leg: He exhibits no tenderness and no edema.       Left lower leg: He exhibits no tenderness and no edema.  Neurological: He is alert and oriented to person, place, and time.  Skin: Skin is warm and dry.  Psychiatric: He has a normal mood and affect. His behavior is normal.  Nursing note and vitals reviewed.   Assessment/Plan:  Principal Problem:   Community acquired pneumonia Active Problems:   CAP (community acquired pneumonia)   Essential hypertension  Mr. Philipp OvensMichael Divita was admitted for  community-acquired pneumonia.  He was initially being treated with ceftriaxone and azithromycin with little improvement in his symptoms.  Antibiotic coverage was broadened--ceftriaxone switched to Unasyn with continuation of azithromycin.  Blood cultures remain no growth to date x2 days.  He does seem to have improved a little today but is continuing to need a significant amount of supplemental oxygen to maintain appropriate oxygen saturations.  Leukocytosis has however improved to 13.4 (17.9 yesterday). Will continue with Unasyn treatment today and wean oxygen throughout the day. I will discontinue azithromycin tx today (completed 5 day course).   Community Acquired Pneumonia: 1. Continue Unasyn IV every 6 hours 2. Discontinue azithromycin today (completed full 5 day course) 3. Continue duo nebs as needed for wheezing 4. Wean oxygen to maintain oxygen saturations >88%  Hypertension: He continued to be hypertensive throughout the day yesterday while on lisinopril 10 mg daily.  Chlorthalidone 30 mg daily subsequently added.  His blood pressure continues to remain elevated.  Blood pressure this morning was 165/113.  We will continue lisinopril and chlorthalidone treatment today and monitor blood pressure. 1. Continue Lisinopril 10 mg QD and chlorthalidone 30 mg daily.  Dispo: Anticipated discharge in approximately 1 to 2 days.  Synetta ShadowPrince, Jamie M, MD 11/30/2017, 7:00 AM Pager: [phone]

## 2017-12-01 DIAGNOSIS — Z79899 Other long term (current) drug therapy: Secondary | ICD-10-CM

## 2017-12-01 LAB — CBC WITH DIFFERENTIAL/PLATELET
ABS IMMATURE GRANULOCYTES: 0.15 10*3/uL — AB (ref 0.00–0.07)
BASOS ABS: 0 10*3/uL (ref 0.0–0.1)
Basophils Relative: 0 %
Eosinophils Absolute: 0.3 10*3/uL (ref 0.0–0.5)
Eosinophils Relative: 2 %
HEMATOCRIT: 42.2 % (ref 39.0–52.0)
Hemoglobin: 13.4 g/dL (ref 13.0–17.0)
IMMATURE GRANULOCYTES: 1 %
LYMPHS ABS: 2.9 10*3/uL (ref 0.7–4.0)
LYMPHS PCT: 22 %
MCH: 26.6 pg (ref 26.0–34.0)
MCHC: 31.8 g/dL (ref 30.0–36.0)
MCV: 83.9 fL (ref 80.0–100.0)
MONOS PCT: 9 %
Monocytes Absolute: 1.2 10*3/uL — ABNORMAL HIGH (ref 0.1–1.0)
NEUTROS PCT: 66 %
NRBC: 0 % (ref 0.0–0.2)
Neutro Abs: 8.4 10*3/uL — ABNORMAL HIGH (ref 1.7–7.7)
Platelets: 325 10*3/uL (ref 150–400)
RBC: 5.03 MIL/uL (ref 4.22–5.81)
RDW: 12.6 % (ref 11.5–15.5)
WBC: 12.9 10*3/uL — ABNORMAL HIGH (ref 4.0–10.5)

## 2017-12-01 MED ORDER — LISINOPRIL-HYDROCHLOROTHIAZIDE 10-12.5 MG PO TABS: 1.0000 | ORAL_TABLET | Freq: Every day | ORAL | 0 refills | Status: DC

## 2017-12-01 MED ORDER — AMOXICILLIN-POT CLAVULANATE 875-125 MG PO TABS
1.0000 | ORAL_TABLET | Freq: Two times a day (BID) | ORAL | Status: DC
  Administered 2017-12-01: 1 via ORAL
  Filled 2017-12-01: qty 1

## 2017-12-01 MED ORDER — HYDROCOD POLST-CPM POLST ER 10-8 MG/5ML PO SUER: 5.0000 mL | Freq: Four times a day (QID) | ORAL | Status: DC | PRN

## 2017-12-01 MED ORDER — HYDROCOD POLST-CPM POLST ER 10-8 MG/5ML PO SUER: 5.0000 mL | ORAL | Status: DC | PRN

## 2017-12-01 MED ORDER — BENZONATATE 200 MG PO CAPS: 200.0000 mg | ORAL_CAPSULE | Freq: Two times a day (BID) | ORAL | 0 refills | Status: DC | PRN

## 2017-12-01 MED ORDER — HYDROCOD POLST-CPM POLST ER 10-8 MG/5ML PO SUER: 5.0000 mL | Freq: Two times a day (BID) | ORAL | Status: DC | PRN

## 2017-12-01 MED ORDER — BENZONATATE 100 MG PO CAPS
200.0000 mg | ORAL_CAPSULE | Freq: Two times a day (BID) | ORAL | Status: DC | PRN
  Administered 2017-12-01: 200 mg via ORAL
  Filled 2017-12-01: qty 2

## 2017-12-01 MED ORDER — HYDROCOD POLST-CPM POLST ER 10-8 MG/5ML PO SUER
5.0000 mL | Freq: Two times a day (BID) | ORAL | Status: DC | PRN
  Administered 2017-12-01: 5 mL via ORAL
  Filled 2017-12-01: qty 5

## 2017-12-01 MED ORDER — HYDROCOD POLST-CPM POLST ER 10-8 MG/5ML PO SUER: 5.0000 mL | Freq: Two times a day (BID) | ORAL | 0 refills | Status: DC | PRN

## 2017-12-01 MED ORDER — AMOXICILLIN-POT CLAVULANATE 875-125 MG PO TABS: 1.0000 | ORAL_TABLET | Freq: Two times a day (BID) | ORAL | 0 refills | Status: AC

## 2017-12-01 NOTE — Progress Notes (Addendum)
   Subjective:  [name] reports that he feels better today.  He states that he does not have shortness of breath but he does have a persistent cough which gives him a headache.  He did not endorse any acute complaints including shortness of breath, chest pain and abdominal pain.  Objective:  Vital signs in last 24 hours: Vitals:   11/30/17 0600 11/30/17 0726 11/30/17 1642 11/30/17 2334  BP:  (!) 165/113 (!) 164/91 (!) 138/95  Pulse:  (!) 115 (!) 118 (!) 111  Resp:  19 19 (!) 25  Temp:  98.2 F (36.8 C) 97.7 F (36.5 C) 99 F (37.2 C)  TempSrc:  Oral Oral Oral  SpO2: 90% 90% 90% 90%  Weight:      Height:       Physical Exam  Constitutional: He appears well-developed and well-nourished.  HENT:  Head: Normocephalic and atraumatic.  Eyes: EOM are normal.  Neck: Normal range of motion.  Cardiovascular: Normal heart sounds.  Tachycardic 110-120s  Pulmonary/Chest:  Scattered expiratory wheezes throughout all lung fields.  Abdominal: Soft. Bowel sounds are normal. He exhibits no distension. There is no tenderness.  Neurological: He is alert.  Skin: Skin is warm and dry.  Psychiatric: He has a normal mood and affect. His behavior is normal.  Nursing note and vitals reviewed.   Assessment/Plan:  Principal Problem:   Community acquired pneumonia Active Problems:   CAP (community acquired pneumonia)   Essential hypertension   [name] was admitted for community-acquired pneumonia now day 3 Unasyn.  He seems better today--no shortness of breath and he is saturating 94-98% on room air when sitting; oxygen sats do decrease to 82-89% when coughing.  I do believe that this will continue to improve with antibiotic treatment.  Additionally his leukocytosis continues to improve today.  We will see how he does throughout the day today with anticipation of discharging his afternoon if he remains stable.    Community Acquired Pneumonia: 1. We will transition him from Unasyn to  Augmentin today for 5 more days of therapy (total 7-day course) 2.  Switch from Robitussin to Tussionex today. 3.  Ordered Tessalon Perles 4.  We will recheck his oxygen saturations this afternoon while off supplemental oxygen. 5.  He will need a repeat chest x-ray in 4 to 6 weeks.  Hypertension:  Blood pressure has improved to SBP 130s to 140s while on lisinopril and chlorthalidone.  We will continue these medications post discharge.  He will need to follow-up with his PCP to have his blood pressure rechecked.   1. Continue Lisinopril 10 mg QD and chlorthalidone 30 mg daily   Dispo: Anticipated discharge in approximately today.  Synetta ShadowPrince,  M, MD 12/01/2017, 6:19 AM Pager: [phone]

## 2017-12-02 LAB — CULTURE, BLOOD (ROUTINE X 2)
CULTURE: NO GROWTH
CULTURE: NO GROWTH
Special Requests: ADEQUATE
Special Requests: ADEQUATE

## 2018-12-26 ENCOUNTER — Encounter (INDEPENDENT_AMBULATORY_CARE_PROVIDER_SITE_OTHER): Payer: Self-pay | Admitting: Primary Care

## 2018-12-26 ENCOUNTER — Ambulatory Visit (INDEPENDENT_AMBULATORY_CARE_PROVIDER_SITE_OTHER): Payer: Self-pay | Admitting: Primary Care

## 2018-12-26 ENCOUNTER — Other Ambulatory Visit: Payer: Self-pay

## 2018-12-26 VITALS — BP 151/105 | HR 78 | Temp 97.1°F | Ht 64.5 in | Wt 263.4 lb

## 2018-12-26 DIAGNOSIS — Z6841 Body Mass Index (BMI) 40.0 and over, adult: Secondary | ICD-10-CM

## 2018-12-26 DIAGNOSIS — I10 Essential (primary) hypertension: Secondary | ICD-10-CM

## 2018-12-26 MED ORDER — CLONIDINE HCL 0.1 MG PO TABS
0.2000 mg | ORAL_TABLET | Freq: Once | ORAL | Status: AC
  Administered 2018-12-26: 0.2 mg via ORAL

## 2018-12-26 MED ORDER — LOSARTAN POTASSIUM 50 MG PO TABS: 50.0000 mg | ORAL_TABLET | Freq: Every day | ORAL | 5 refills | Status: DC

## 2018-12-26 MED ORDER — HYDROCHLOROTHIAZIDE 25 MG PO TABS: 25.0000 mg | ORAL_TABLET | Freq: Every day | ORAL | 5 refills | Status: DC

## 2018-12-26 NOTE — Progress Notes (Signed)
Presents for  hypertension evaluation, on previous visit medication was adjusted to include   Patient is not adherence with medications he has been out of medication for a year.  Current Medication List Current Outpatient Medications on File Prior to Visit  Medication Sig Dispense Refill  . lisinopril-hydrochlorothiazide (ZESTORETIC) 10-12.5 MG tablet Take 1 tablet by mouth daily. (Patient not taking: Reported on 12/26/2018) 30 tablet 0   No current facility-administered medications on file prior to visit.   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  Constitutional:      Appearance: Normal appearance. He is obese.  HENT:     Head: Normocephalic.  Eyes:     Extraocular Movements: Extraocular movements intact.     Pupils: Pupils are equal, round, and reactive to light.  Cardiovascular:     Rate and Rhythm: Normal rate and regular rhythm.  Pulmonary:     Effort: Pulmonary effort is normal.     Breath sounds: Normal breath sounds.  Abdominal:     General: Bowel sounds are normal.     Palpations: Abdomen is soft.  Musculoskeletal:        General: Normal range of motion.     Cervical back: Normal range of motion and neck supple.  Skin:    General: Skin is warm and dry.  Neurological:     Mental Status: He is alert and oriented to person, place, and time.  Psychiatric:        Mood and Affect: Mood normal.        Thought Content: Thought content normal.      Review of Systems  All other systems reviewed and are negative.   Last 3 Office BP readings: BP Readings from Last 3 Encounters:  12/01/17 (!) 142/93  10/05/15 134/99  06/08/14 121/76    BMET    Component Value Date/Time   NA 138 11/30/2017 0217   NA 144 07/09/2016 1645   K 3.6 11/30/2017 0217   CL 104  11/30/2017 0217   CO2 24 11/30/2017 0217   GLUCOSE 121 (H) 11/30/2017 0217   BUN 7 11/30/2017 0217   BUN 15 07/09/2016 1645   CREATININE 0.82 11/30/2017 0217   CALCIUM 9.3 11/30/2017 0217   GFRNONAA >60 11/30/2017 0217   GFRAA >60 11/30/2017 0217    Renal function: CrCl cannot be calculated (Patient's most recent lab result is older than the maximum 21 days allowed.).  Clinical ASCVD: Yes  The ASCVD Risk score Mikey Bussing DC Jr., et al., 2013) failed to calculate for the following reasons:   The systolic blood pressure is missing   Cannot find a previous HDL lab   Cannot find a previous total cholesterol lab   A/P: Hypertension longstanding on no current medications. BP Goal = 130/80  mmHg. Patient has not taken Bp meds but donates plasma regularly and advised him to follow up with PCP. Treated in clinic with clonidine .2mg   Fielding was seen today for establish care.   Morbid obesity ([place]) Morbid Obesity is > 40 BMI which can indicate an excess in caloric intake or underlining conditions. This may lead to other co-morbidities uncontolled hypertension, CVD, .diabetes and joint pain. Lifestyle modifications of diet and exercise may reduce obesity.  -     Lipid Panel  Other orders -  hydrochlorothiazide (HYDRODIURIL) 25 MG tablet; Take 1 tablet (25 mg total) by mouth daily. -     losartan (COZAAR) 50 MG tablet; Take 1 tablet (50 mg total) by mouth daily.   -F/u labs ordered - CMP, lipid, CBC -Counseled on lifestyle modifications for blood pressure control including reduced dietary sodium, increased exercise, adequate sleep  Results none and written information provided.   Total time in face-to-face counseling15 minutes.   F/U Clinic Visit in 4-6 weeks to purchase blood pressure monitor for a tele visit .Marland Kitchen  Patient seen with Grayce Sessions

## 2018-12-26 NOTE — Patient Instructions (Signed)
Managing Your Hypertension Hypertension is commonly called high blood pressure. This is when the force of your blood pressing against the walls of your arteries is too strong. Arteries are blood vessels that carry blood from your heart throughout your body. Hypertension forces the heart to work harder to pump blood, and may cause the arteries to become narrow or stiff. Having untreated or uncontrolled hypertension can cause heart attack, stroke, kidney disease, and other problems. What are blood pressure readings? A blood pressure reading consists of a higher number over a lower number. Ideally, your blood pressure should be below 120/80. The first ("top") number is called the systolic pressure. It is a measure of the pressure in your arteries as your heart beats. The second ("bottom") number is called the diastolic pressure. It is a measure of the pressure in your arteries as the heart relaxes. What does my blood pressure reading mean? Blood pressure is classified into four stages. Based on your blood pressure reading, your health care provider may use the following stages to determine what type of treatment you need, if any. Systolic pressure and diastolic pressure are measured in a unit called mm Hg. Normal  Systolic pressure: below 120.  Diastolic pressure: below 80. Elevated  Systolic pressure: 120-129.  Diastolic pressure: below 80. Hypertension stage 1  Systolic pressure: 130-139.  Diastolic pressure: 80-89. Hypertension stage 2  Systolic pressure: 140 or above.  Diastolic pressure: 90 or above. What health risks are associated with hypertension? Managing your hypertension is an important responsibility. Uncontrolled hypertension can lead to:  A heart attack.  A stroke.  A weakened blood vessel (aneurysm).  Heart failure.  Kidney damage.  Eye damage.  Metabolic syndrome.  Memory and concentration problems. What changes can I make to manage my  hypertension? Hypertension can be managed by making lifestyle changes and possibly by taking medicines. Your health care provider will help you make a plan to bring your blood pressure within a normal range. Eating and drinking   Eat a diet that is high in fiber and potassium, and low in salt (sodium), added sugar, and fat. An example eating plan is called the DASH (Dietary Approaches to Stop Hypertension) diet. To eat this way: ? Eat plenty of fresh fruits and vegetables. Try to fill half of your plate at each meal with fruits and vegetables. ? Eat whole grains, such as whole wheat pasta, brown rice, or whole grain bread. Fill about one quarter of your plate with whole grains. ? Eat low-fat diary products. ? Avoid fatty cuts of meat, processed or cured meats, and poultry with skin. Fill about one quarter of your plate with lean proteins such as fish, chicken without skin, beans, eggs, and tofu. ? Avoid premade and processed foods. These tend to be higher in sodium, added sugar, and fat.  Reduce your daily sodium intake. Most people with hypertension should eat less than 1,500 mg of sodium a day.  Limit alcohol intake to no more than 1 drink a day for nonpregnant women and 2 drinks a day for men. One drink equals 12 oz of beer, 5 oz of wine, or 1 oz of hard liquor. Lifestyle  Work with your health care provider to maintain a healthy body weight, or to lose weight. Ask what an ideal weight is for you.  Get at least 30 minutes of exercise that causes your heart to beat faster (aerobic exercise) most days of the week. Activities may include walking, swimming, or biking.  Include exercise   to strengthen your muscles (resistance exercise), such as weight lifting, as part of your weekly exercise routine. Try to do these types of exercises for 30 minutes at least 3 days a week.  Do not use any products that contain nicotine or tobacco, such as cigarettes and e-cigarettes. If you need help quitting,  ask your health care provider.  Control any long-term (chronic) conditions you have, such as high cholesterol or diabetes. Monitoring  Monitor your blood pressure at home as told by your health care provider. Your personal target blood pressure may vary depending on your medical conditions, your age, and other factors.  Have your blood pressure checked regularly, as often as told by your health care provider. Working with your health care provider  Review all the medicines you take with your health care provider because there may be side effects or interactions.  Talk with your health care provider about your diet, exercise habits, and other lifestyle factors that may be contributing to hypertension.  Visit your health care provider regularly. Your health care provider can help you create and adjust your plan for managing hypertension. Will I need medicine to control my blood pressure? Your health care provider may prescribe medicine if lifestyle changes are not enough to get your blood pressure under control, and if:  Your systolic blood pressure is 130 or higher.  Your diastolic blood pressure is 80 or higher. Take medicines only as told by your health care provider. Follow the directions carefully. Blood pressure medicines must be taken as prescribed. The medicine does not work as well when you skip doses. Skipping doses also puts you at risk for problems. Contact a health care provider if:  You think you are having a reaction to medicines you have taken.  You have repeated (recurrent) headaches.  You feel dizzy.  You have swelling in your ankles.  You have trouble with your vision. Get help right away if:  You develop a severe headache or confusion.  You have unusual weakness or numbness, or you feel faint.  You have severe pain in your chest or abdomen.  You vomit repeatedly.  You have trouble breathing. Summary  Hypertension is when the force of blood pumping  through your arteries is too strong. If this condition is not controlled, it may put you at risk for serious complications.  Your personal target blood pressure may vary depending on your medical conditions, your age, and other factors. For most people, a normal blood pressure is less than 120/80.  Hypertension is managed by lifestyle changes, medicines, or both. Lifestyle changes include weight loss, eating a healthy, low-sodium diet, exercising more, and limiting alcohol. This information is not intended to replace advice given to you by your health care provider. Make sure you discuss any questions you have with your health care provider. Document Released: 09/23/2011 Document Revised: 04/22/2018 Document Reviewed: 11/27/2015 Elsevier Patient Education  2020 [employer]  

## 2018-12-27 ENCOUNTER — Other Ambulatory Visit (INDEPENDENT_AMBULATORY_CARE_PROVIDER_SITE_OTHER): Payer: Self-pay | Admitting: Primary Care

## 2018-12-27 LAB — CBC WITH DIFFERENTIAL/PLATELET
Basophils Absolute: 0 10*3/uL (ref 0.0–0.2)
Basos: 0 %
EOS (ABSOLUTE): 0.1 10*3/uL (ref 0.0–0.4)
Eos: 1 %
Hematocrit: 46.2 % (ref 37.5–51.0)
Hemoglobin: 15 g/dL (ref 13.0–17.7)
Immature Grans (Abs): 0.1 10*3/uL (ref 0.0–0.1)
Immature Granulocytes: 1 %
Lymphocytes Absolute: 3.4 10*3/uL — ABNORMAL HIGH (ref 0.7–3.1)
Lymphs: 38 %
MCH: 27.2 pg (ref 26.6–33.0)
MCHC: 32.5 g/dL (ref 31.5–35.7)
MCV: 84 fL (ref 79–97)
Monocytes Absolute: 0.8 10*3/uL (ref 0.1–0.9)
Monocytes: 9 %
Neutrophils Absolute: 4.7 10*3/uL (ref 1.4–7.0)
Neutrophils: 51 %
Platelets: 332 10*3/uL (ref 150–450)
RBC: 5.51 x10E6/uL (ref 4.14–5.80)
RDW: 13.5 % (ref 11.6–15.4)
WBC: 9.1 10*3/uL (ref 3.4–10.8)

## 2018-12-27 LAB — LIPID PANEL
Chol/HDL Ratio: 4.8 ratio (ref 0.0–5.0)
Cholesterol, Total: 187 mg/dL (ref 100–199)
HDL: 39 mg/dL — ABNORMAL LOW (ref >39)
LDL Chol Calc (NIH): 107 mg/dL — ABNORMAL HIGH (ref 0–99)
Triglycerides: 240 mg/dL — ABNORMAL HIGH (ref 0–149)
VLDL Cholesterol Cal: 41 mg/dL — ABNORMAL HIGH (ref 5–40)

## 2018-12-27 LAB — CMP14+EGFR
ALT: 36 IU/L (ref 0–44)
AST: 27 IU/L (ref 0–40)
Albumin/Globulin Ratio: 1.9 (ref 1.2–2.2)
Albumin: 5.1 g/dL — ABNORMAL HIGH (ref 4.0–5.0)
Alkaline Phosphatase: 76 IU/L (ref 39–117)
BUN/Creatinine Ratio: 14 (ref 9–20)
BUN: 12 mg/dL (ref 6–24)
Bilirubin Total: 0.2 mg/dL (ref 0.0–1.2)
CO2: 22 mmol/L (ref 20–29)
Calcium: 9.7 mg/dL (ref 8.7–10.2)
Chloride: 103 mmol/L (ref 96–106)
Creatinine, Ser: 0.84 mg/dL (ref 0.76–1.27)
GFR calc Af Amer: 127 mL/min/{1.73_m2} (ref >59)
GFR calc non Af Amer: 110 mL/min/{1.73_m2} (ref >59)
Globulin, Total: 2.7 g/dL (ref 1.5–4.5)
Glucose: 102 mg/dL — ABNORMAL HIGH (ref 65–99)
Potassium: 4.8 mmol/L (ref 3.5–5.2)
Sodium: 141 mmol/L (ref 134–144)
Total Protein: 7.8 g/dL (ref 6.0–8.5)

## 2018-12-27 MED ORDER — PRAVASTATIN SODIUM 40 MG PO TABS: 40.0000 mg | ORAL_TABLET | Freq: Every day | ORAL | 1 refills | Status: DC

## 2019-01-22 IMAGING — CT CT ANGIO CHEST
2 of 8 series · 18 of 46 positions shown · IV contrast (iopamidol)
Comparison: None.

CLINICAL DATA: Cough, shortness of breath

EXAM:
CT ANGIOGRAPHY CHEST WITH CONTRAST
TECHNIQUE: Multidetector CT imaging of the chest was performed using the
standard protocol during bolus administration of intravenous
contrast. Multiplanar CT image reconstructions and MIPs were
obtained to evaluate the vascular anatomy.
CONTRAST:  100mL [id number] IOPAMIDOL (SS2NGQ-UFK) INJECTION 76%

[Series 7: thins · axial · 0.80mm/px · z∈[+1196,+1481]mm · 15 of 315 slices shown]
[im 15/315  lung]
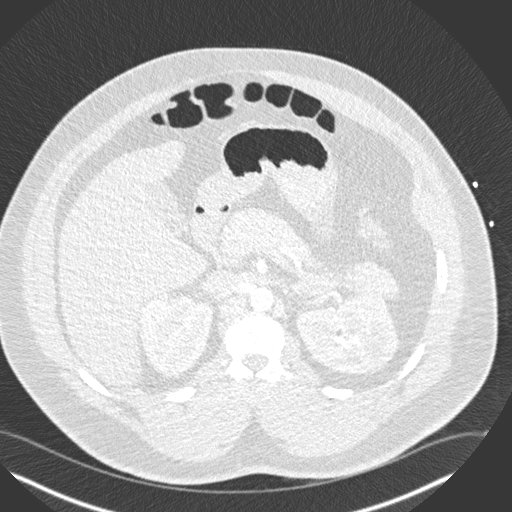
[im 43/315  soft-tissue]
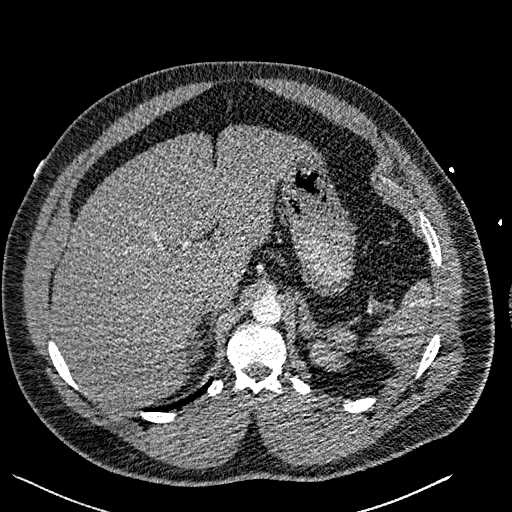
[im 58/315  lung]
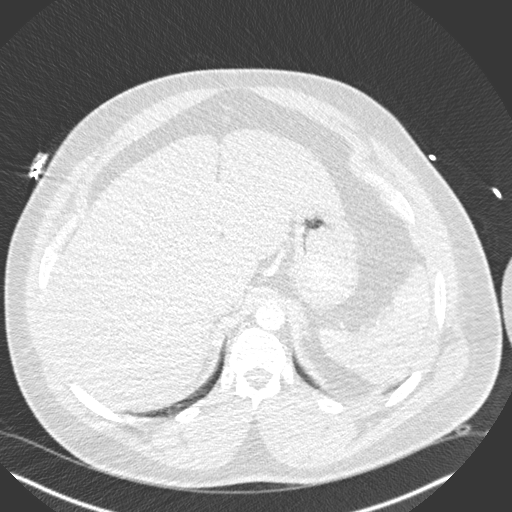
[im 72/315  soft-tissue]
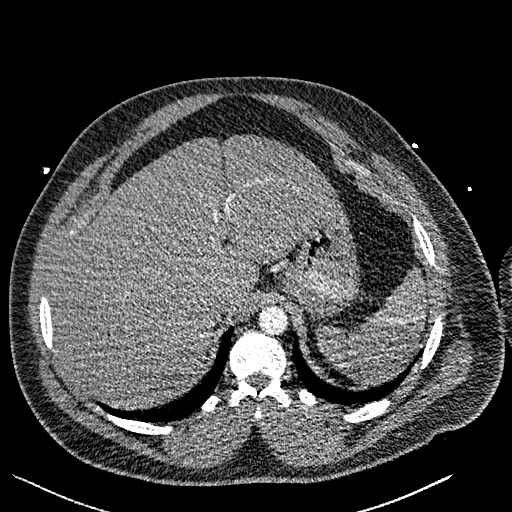
[im 100/315  lung]
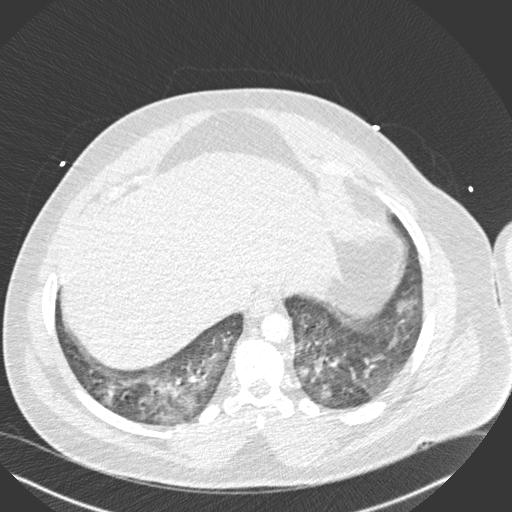
[im 115/315  soft-tissue]
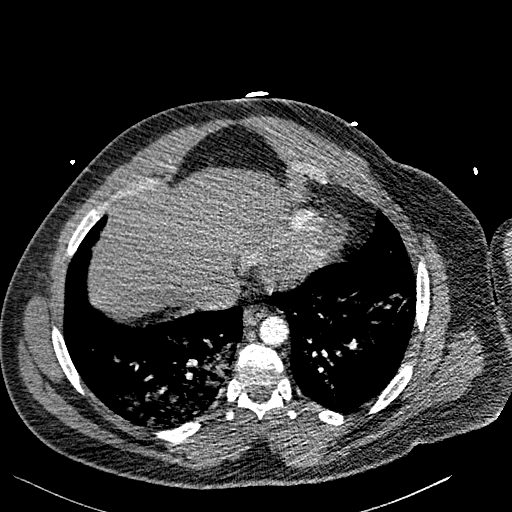
[im 143/315  lung]
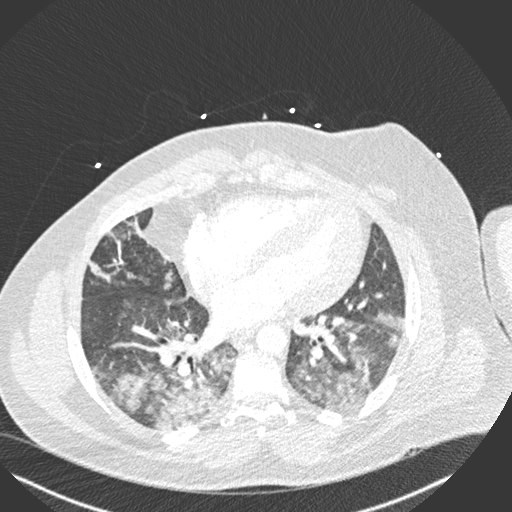
[im 158/315  soft-tissue]
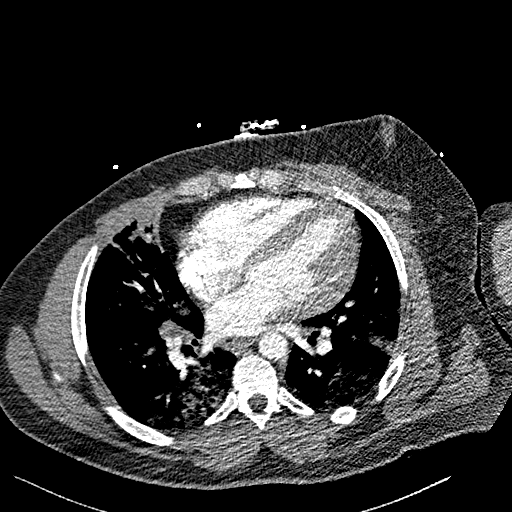
[im 172/315  lung]
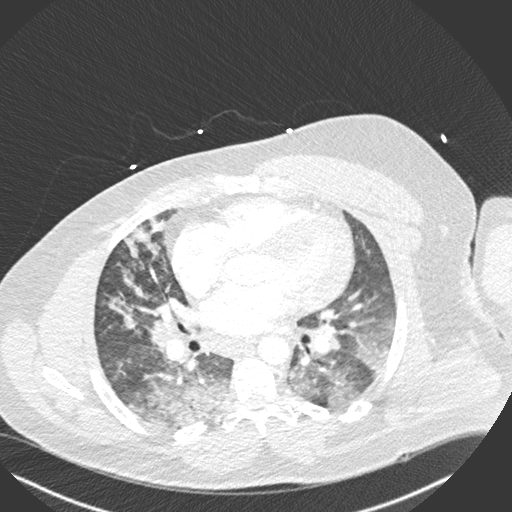
[im 200/315  soft-tissue]
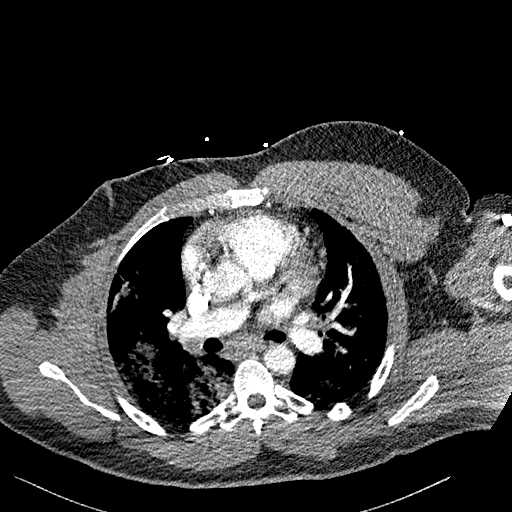
[im 215/315  lung]
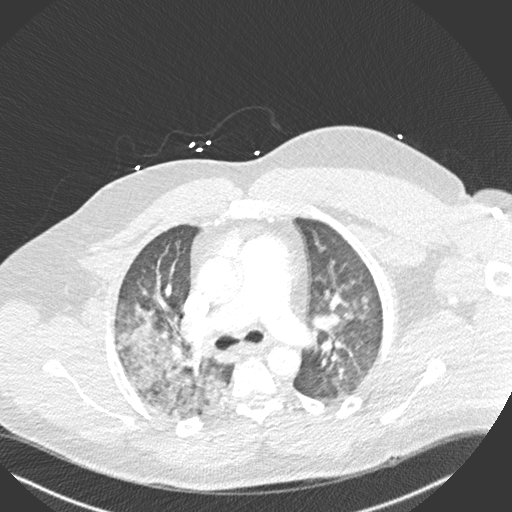
[im 243/315  soft-tissue]
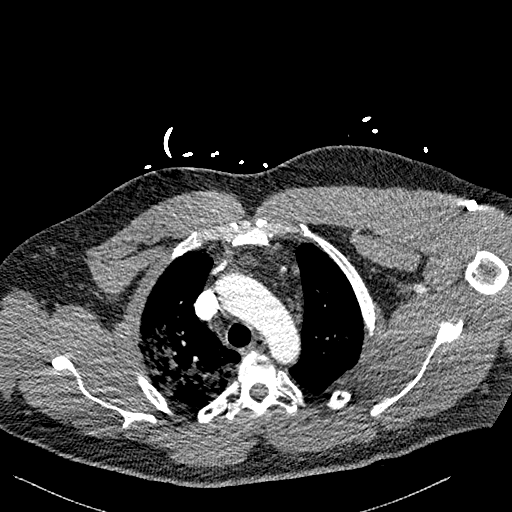
[im 257/315  lung]
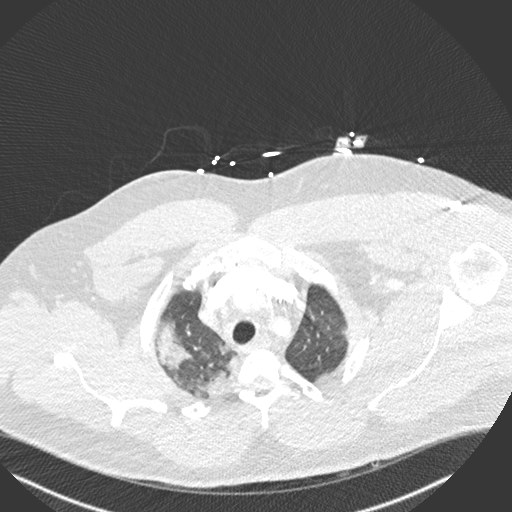
[im 272/315  soft-tissue]
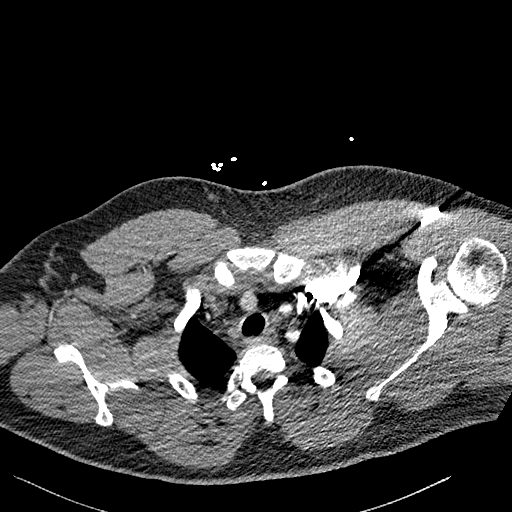
[im 300/315  lung]
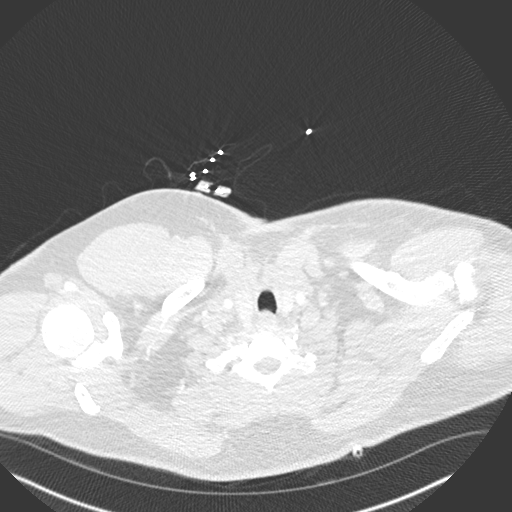

[Series 8: coronal mpr · coronal · 0.62mm/px · 3 of 140 slices shown]
[im 35/140  soft-tissue]
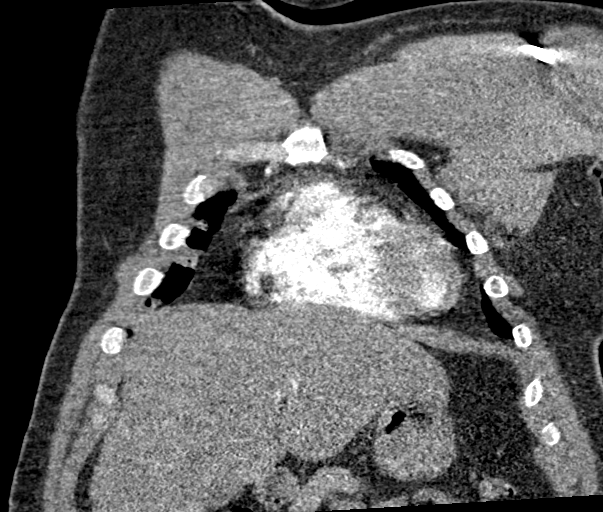
[im 70/140  soft-tissue]
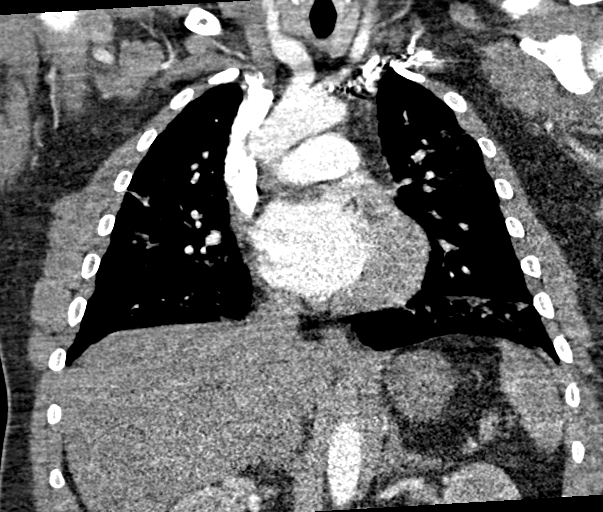
[im 105/140  soft-tissue]
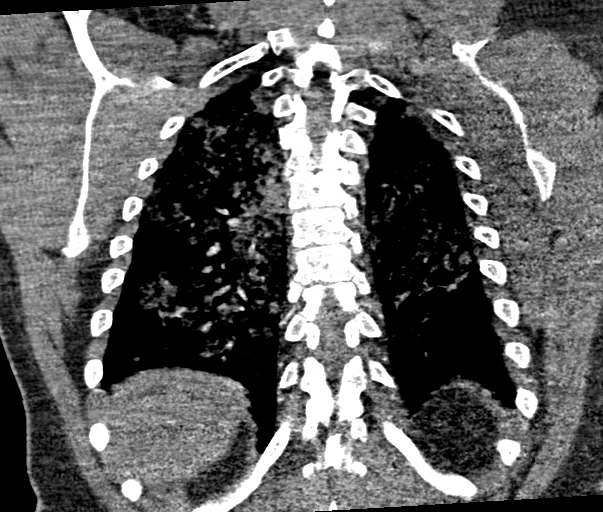

[18 of 46 positions shown; findings below may reference images not displayed]

FINDINGS: Cardiovascular: No filling defects in the pulmonary arteries to
suggest pulmonary emboli. Heart is upper limits normal in size.
Aorta is normal caliber.

Mediastinum/Nodes: Right hilar and subcarinal adenopathy. Subcarinal
lymph node has a short axis diameter of 1.6 cm. Right inferior hilar
lymph node has a short axis diameter of 1.6 cm. No axillary
adenopathy.

Lungs/Pleura: Bilateral airspace disease, most confluent throughout
the right lung, also noted in the left upper and left lower lobes.
No effusions. Findings most compatible with multifocal pneumonia.

Upper Abdomen: Imaging into the upper abdomen shows no acute
findings.

Musculoskeletal: Chest wall soft tissues are unremarkable. No acute
bony abnormality.

Review of the MIP images confirms the above findings.
IMPRESSION: No evidence of pulmonary embolus.

Bilateral airspace disease, right greater than left compatible with
multifocal pneumonia.

Reactive right hilar and mediastinal lymph nodes.

## 2019-01-23 ENCOUNTER — Other Ambulatory Visit: Payer: Self-pay

## 2019-01-23 ENCOUNTER — Ambulatory Visit (INDEPENDENT_AMBULATORY_CARE_PROVIDER_SITE_OTHER): Payer: Self-pay | Admitting: Primary Care

## 2019-01-23 ENCOUNTER — Encounter (INDEPENDENT_AMBULATORY_CARE_PROVIDER_SITE_OTHER): Payer: Self-pay | Admitting: Primary Care

## 2019-01-23 VITALS — BP 147/98 | HR 102 | Temp 97.3°F | Ht 64.5 in | Wt 262.6 lb

## 2019-01-23 DIAGNOSIS — I10 Essential (primary) hypertension: Secondary | ICD-10-CM

## 2019-01-23 DIAGNOSIS — E785 Hyperlipidemia, unspecified: Secondary | ICD-10-CM

## 2019-01-23 DIAGNOSIS — Z6841 Body Mass Index (BMI) 40.0 and over, adult: Secondary | ICD-10-CM

## 2019-01-23 MED ORDER — DILTIAZEM HCL ER COATED BEADS 120 MG PO CP24: 120.0000 mg | ORAL_CAPSULE | Freq: Every day | ORAL | 1 refills | Status: DC

## 2019-01-23 NOTE — Progress Notes (Signed)
Established Patient Office Visit  Subjective:  Patient ID: Steven Houston, male    DOB: 06-27-1980  Age: 39 y.o. MRN: [id number]  CC:  Chief Complaint  Patient presents with  . Blood Pressure Check    HPI Steven Houston presents for management of hypertension. Blood pressure remains elevated currently on HCTZ 25mg  and Cozaar 5omg  Both daily he states he takes his Bp medication at night.Explain still should not remain this high  . He denies shortness of breath, headaches, chest pain or lower extremity edema   Past Medical History:  Diagnosis Date  . Bipolar disorder ([place])     No past surgical history on file.  No family history on file.  Social History   Socioeconomic History  . Marital status: Single    Spouse name: Not on file  . Number of children: Not on file  . Years of education: Not on file  . Highest education level: Not on file  Occupational History  . Not on file  Tobacco Use  . Smoking status: Never Smoker  . Smokeless tobacco: Never Used  Substance and Sexual Activity  . Alcohol use: Not Currently  . Drug use: Not Currently  . Sexual activity: Not on file  Other Topics Concern  . Not on file  Social History Narrative  . Not on file   Social Determinants of Health   Financial Resource Strain:   . Difficulty of Paying Living Expenses: Not on file  Food Insecurity:   . Worried About Charity fundraiser in the Last Year: Not on file  . Ran Out of Food in the Last Year: Not on file  Transportation Needs:   . Lack of Transportation (Medical): Not on file  . Lack of Transportation (Non-Medical): Not on file  Physical Activity:   . Days of Exercise per Week: Not on file  . Minutes of Exercise per Session: Not on file  Stress:   . Feeling of Stress : Not on file  Social Connections:   . Frequency of Communication with Friends and Family: Not on file  . Frequency of Social Gatherings with Friends and Family: Not on file  . Attends Religious Services: Not  on file  . Active Member of Clubs or Organizations: Not on file  . Attends Archivist Meetings: Not on file  . Marital Status: Not on file  Intimate Partner Violence:   . Fear of Current or Ex-Partner: Not on file  . Emotionally Abused: Not on file  . Physically Abused: Not on file  . Sexually Abused: Not on file    Outpatient Medications Prior to Visit  Medication Sig Dispense Refill  . hydrochlorothiazide (HYDRODIURIL) 25 MG tablet Take 1 tablet (25 mg total) by mouth daily. 30 tablet 5  . losartan (COZAAR) 50 MG tablet Take 1 tablet (50 mg total) by mouth daily. 30 tablet 5  . pravastatin (PRAVACHOL) 40 MG tablet Take 1 tablet (40 mg total) by mouth daily. 90 tablet 1   No facility-administered medications prior to visit.    Allergies  Allergen Reactions  . Haldol [Haloperidol Lactate] Anaphylaxis and Shortness Of Breath  . Amlodipine Other (See Comments)    Nausea    ROS Review of Systems  All other systems reviewed and are negative.     Objective:    Physical Exam  Constitutional: He is oriented to person, place, and time. He appears well-developed and well-nourished.  HENT:  Head: Normocephalic.  Cardiovascular: Normal rate and regular  rhythm.  Pulmonary/Chest: Effort normal and breath sounds normal.  Abdominal: Soft. Bowel sounds are normal.  Musculoskeletal:        General: Normal range of motion.     Cervical back: Normal range of motion and neck supple.  Neurological: He is oriented to person, place, and time.  Skin: Skin is warm and dry.  Psychiatric: He has a normal mood and affect. His behavior is normal. Judgment and thought content normal.    BP (!) 148/95 (BP Location: Left Arm, Patient Position: Sitting, Cuff Size: Large)   Pulse 99   Temp (!) 97.3 F (36.3 C) (Temporal)   Ht 5' 4.5" (1.638 m)   Wt 262 lb 9.6 oz (119.1 kg)   SpO2 96%   BMI 44.38 kg/m  Wt Readings from Last 3 Encounters:  01/23/19 262 lb 9.6 oz (119.1 kg)   12/26/18 263 lb 6.4 oz (119.5 kg)  11/27/17 250 lb (113.4 kg)     There are no preventive care reminders to display for this patient.  There are no preventive care reminders to display for this patient.  Lab Results  Component Value Date   TSH 0.822 11/28/2017   Lab Results  Component Value Date   WBC 9.1 12/26/2018   HGB 15.0 12/26/2018   HCT 46.2 12/26/2018   MCV 84 12/26/2018   PLT 332 12/26/2018   Lab Results  Component Value Date   NA 141 12/26/2018   K 4.8 12/26/2018   CO2 22 12/26/2018   GLUCOSE 102 (H) 12/26/2018   BUN 12 12/26/2018   CREATININE 0.84 12/26/2018   BILITOT 0.2 12/26/2018   ALKPHOS 76 12/26/2018   AST 27 12/26/2018   ALT 36 12/26/2018   PROT 7.8 12/26/2018   ALBUMIN 5.1 (H) 12/26/2018   CALCIUM 9.7 12/26/2018   ANIONGAP 10 11/30/2017   Lab Results  Component Value Date   CHOL 187 12/26/2018   Lab Results  Component Value Date   HDL 39 (L) 12/26/2018   Lab Results  Component Value Date   LDLCALC 107 (H) 12/26/2018   Lab Results  Component Value Date   TRIG 240 (H) 12/26/2018   Lab Results  Component Value Date   CHOLHDL 4.8 12/26/2018   No results found for: HGBA1C    Assessment & Plan:   Steven Houston was seen today for blood pressure check.  Diagnoses and all orders for this visit:  Essential hypertension Blood pressure is not at  goal of less than 130/80, low-sodium, DASH diet, medication compliance, 150 minutes of moderate intensity exercise per week. Added diltiazem 120 mg to improve reading. Discussed medication compliance, adverse effects.  Morbid obesity (HCC) Stated changed diet but is going to incorporate incorporate exercising which can improve Bp reading.  Hyperlipidemia, unspecified hyperlipidemia type Reviewed labs with patient unable to view lost password for my chart Decrease your fatty foods, red meat, cheese, milk and increase fiber like whole grains and veggies. You can also add a fiber supplement like  Metamucil or Benefiber. After reviewing labs added pravastatin 40mg  Qhs   Other orders -     diltiazem (CARDIZEM CD) 120 MG 24 hr capsule; Take 1 capsule (120 mg total) by mouth daily.    Follow-up: No follow-ups on file.    , NP

## 2019-01-23 NOTE — Patient Instructions (Signed)
   Managing Your Hypertension Hypertension is commonly called high blood pressure. This is when the force of your blood pressing against the walls of your arteries is too strong. Arteries are blood vessels that carry blood from your heart throughout your body. Hypertension forces the heart to work harder to pump blood, and may cause the arteries to become narrow or stiff. Having untreated or uncontrolled hypertension can cause heart attack, stroke, kidney disease, and other problems. What are blood pressure readings? A blood pressure reading consists of a higher number over a lower number. Ideally, your blood pressure should be below 120/80. The first ("top") number is called the systolic pressure. It is a measure of the pressure in your arteries as your heart beats. The second ("bottom") number is called the diastolic pressure. It is a measure of the pressure in your arteries as the heart relaxes. What does my blood pressure reading mean? Blood pressure is classified into four stages. Based on your blood pressure reading, your health care provider may use the following stages to determine what type of treatment you need, if any. Systolic pressure and diastolic pressure are measured in a unit called mm Hg. Normal  Systolic pressure: below 120.  Diastolic pressure: below 80. Elevated  Systolic pressure: 120-129.  Diastolic pressure: below 80. Hypertension stage 1  Systolic pressure: 130-139.  Diastolic pressure: 80-89. Hypertension stage 2  Systolic pressure: 140 or above.  Diastolic pressure: 90 or above. What health risks are associated with hypertension? Managing your hypertension is an important responsibility. Uncontrolled hypertension can lead to:  A heart attack.  A stroke.  A weakened blood vessel (aneurysm).  Heart failure.  Kidney damage.  Eye damage.  Metabolic syndrome.  Memory and concentration problems. What changes can I make to manage my  hypertension? Hypertension can be managed by making lifestyle changes and possibly by taking medicines. Your health care provider will help you make a plan to bring your blood pressure within a normal range. Eating and drinking   Eat a diet that is high in fiber and potassium, and low in salt (sodium), added sugar, and fat. An example eating plan is called the DASH (Dietary Approaches to Stop Hypertension) diet. To eat this way: ? Eat plenty of fresh fruits and vegetables. Try to fill half of your plate at each meal with fruits and vegetables. ? Eat whole grains, such as whole wheat pasta, brown rice, or whole grain bread. Fill about one quarter of your plate with whole grains. ? Eat low-fat diary products. ? Avoid fatty cuts of meat, processed or cured meats, and poultry with skin. Fill about one quarter of your plate with lean proteins such as fish, chicken without skin, beans, eggs, and tofu. ? Avoid premade and processed foods. These tend to be higher in sodium, added sugar, and fat.  Reduce your daily sodium intake. Most people with hypertension should eat less than 1,500 mg of sodium a day.  Limit alcohol intake to no more than 1 drink a day for nonpregnant women and 2 drinks a day for men. One drink equals 12 oz of beer, 5 oz of wine, or 1 oz of hard liquor. Lifestyle  Work with your health care provider to maintain a healthy body weight, or to lose weight. Ask what an ideal weight is for you.  Get at least 30 minutes of exercise that causes your heart to beat faster (aerobic exercise) most days of the week. Activities may include walking, swimming, or biking.    Include exercise to strengthen your muscles (resistance exercise), such as weight lifting, as part of your weekly exercise routine. Try to do these types of exercises for 30 minutes at least 3 days a week.  Do not use any products that contain nicotine or tobacco, such as cigarettes and e-cigarettes. If you need help quitting,  ask your health care provider.  Control any long-term (chronic) conditions you have, such as high cholesterol or diabetes. Monitoring  Monitor your blood pressure at home as told by your health care provider. Your personal target blood pressure may vary depending on your medical conditions, your age, and other factors.  Have your blood pressure checked regularly, as often as told by your health care provider. Working with your health care provider  Review all the medicines you take with your health care provider because there may be side effects or interactions.  Talk with your health care provider about your diet, exercise habits, and other lifestyle factors that may be contributing to hypertension.  Visit your health care provider regularly. Your health care provider can help you create and adjust your plan for managing hypertension. Will I need medicine to control my blood pressure? Your health care provider may prescribe medicine if lifestyle changes are not enough to get your blood pressure under control, and if:  Your systolic blood pressure is 130 or higher.  Your diastolic blood pressure is 80 or higher. Take medicines only as told by your health care provider. Follow the directions carefully. Blood pressure medicines must be taken as prescribed. The medicine does not work as well when you skip doses. Skipping doses also puts you at risk for problems. Contact a health care provider if:  You think you are having a reaction to medicines you have taken.  You have repeated (recurrent) headaches.  You feel dizzy.  You have swelling in your ankles.  You have trouble with your vision. Get help right away if:  You develop a severe headache or confusion.  You have unusual weakness or numbness, or you feel faint.  You have severe pain in your chest or abdomen.  You vomit repeatedly.  You have trouble breathing. Summary  Hypertension is when the force of blood pumping  through your arteries is too strong. If this condition is not controlled, it may put you at risk for serious complications.  Your personal target blood pressure may vary depending on your medical conditions, your age, and other factors. For most people, a normal blood pressure is less than 120/80.  Hypertension is managed by lifestyle changes, medicines, or both. Lifestyle changes include weight loss, eating a healthy, low-sodium diet, exercising more, and limiting alcohol. This information is not intended to replace advice given to you by your health care provider. Make sure you discuss any questions you have with your health care provider. Document Revised: 04/22/2018 Document Reviewed: 11/27/2015 Elsevier Patient Education  2020 [employer]  

## 2019-03-06 ENCOUNTER — Other Ambulatory Visit: Payer: Self-pay

## 2019-03-06 ENCOUNTER — Ambulatory Visit (INDEPENDENT_AMBULATORY_CARE_PROVIDER_SITE_OTHER): Payer: Self-pay | Admitting: Primary Care

## 2019-03-06 ENCOUNTER — Encounter (INDEPENDENT_AMBULATORY_CARE_PROVIDER_SITE_OTHER): Payer: Self-pay | Admitting: Primary Care

## 2019-03-06 VITALS — BP 131/87 | HR 79 | Temp 97.3°F | Ht 64.5 in | Wt 262.8 lb

## 2019-03-06 DIAGNOSIS — I10 Essential (primary) hypertension: Secondary | ICD-10-CM

## 2019-03-06 DIAGNOSIS — Z760 Encounter for issue of repeat prescription: Secondary | ICD-10-CM

## 2019-03-06 DIAGNOSIS — Z6841 Body Mass Index (BMI) 40.0 and over, adult: Secondary | ICD-10-CM

## 2019-03-06 DIAGNOSIS — E785 Hyperlipidemia, unspecified: Secondary | ICD-10-CM

## 2019-03-06 MED ORDER — LOSARTAN POTASSIUM 50 MG PO TABS: 50.0000 mg | ORAL_TABLET | Freq: Every day | ORAL | 1 refills | Status: DC

## 2019-03-06 MED ORDER — DILTIAZEM HCL ER COATED BEADS 120 MG PO CP24: 120.0000 mg | ORAL_CAPSULE | Freq: Every day | ORAL | 1 refills | Status: DC

## 2019-03-06 MED ORDER — HYDROCHLOROTHIAZIDE 25 MG PO TABS: 25.0000 mg | ORAL_TABLET | Freq: Every day | ORAL | 1 refills | Status: DC

## 2019-03-06 MED ORDER — PRAVASTATIN SODIUM 40 MG PO TABS: 40.0000 mg | ORAL_TABLET | Freq: Every day | ORAL | 1 refills | Status: DC

## 2019-03-06 NOTE — Patient Instructions (Signed)

## 2019-03-06 NOTE — Progress Notes (Signed)
Established Patient Office Visit  Subjective:  Patient ID: Steven Houston, male    DOB: 1980-11-12  Age: 39 y.o. MRN: [id number]  CC:  Chief Complaint  Patient presents with  . Blood Pressure Check    HPI Steven Houston presents for blood pressure management has improved today 131/87. He admits that he thought he was feeling pretty good now better since he has been taking his Bp medication, watching what he eats and exercising. Denies shortness of breath, headaches, chest pain or lower extremity edema  Past Medical History:  Diagnosis Date  . Bipolar disorder (HCC)     History reviewed. No pertinent surgical history.  History reviewed. No pertinent family history.  Social History   Socioeconomic History  . Marital status: Single    Spouse name: Not on file  . Number of children: Not on file  . Years of education: Not on file  . Highest education level: Not on file  Occupational History  . Not on file  Tobacco Use  . Smoking status: Never Smoker  . Smokeless tobacco: Never Used  Substance and Sexual Activity  . Alcohol use: Not Currently  . Drug use: Not Currently  . Sexual activity: Not on file  Other Topics Concern  . Not on file  Social History Narrative  . Not on file   Social Determinants of Health   Financial Resource Strain:   . Difficulty of Paying Living Expenses: Not on file  Food Insecurity:   . Worried About Programme researcher, broadcasting/film/video in the Last Year: Not on file  . Ran Out of Food in the Last Year: Not on file  Transportation Needs:   . Lack of Transportation (Medical): Not on file  . Lack of Transportation (Non-Medical): Not on file  Physical Activity:   . Days of Exercise per Week: Not on file  . Minutes of Exercise per Session: Not on file  Stress:   . Feeling of Stress : Not on file  Social Connections:   . Frequency of Communication with Friends and Family: Not on file  . Frequency of Social Gatherings with Friends and Family: Not on file  .  Attends Religious Services: Not on file  . Active Member of Clubs or Organizations: Not on file  . Attends Banker Meetings: Not on file  . Marital Status: Not on file  Intimate Partner Violence:   . Fear of Current or Ex-Partner: Not on file  . Emotionally Abused: Not on file  . Physically Abused: Not on file  . Sexually Abused: Not on file    Outpatient Medications Prior to Visit  Medication Sig Dispense Refill  . diltiazem (CARDIZEM CD) 120 MG 24 hr capsule Take 1 capsule (120 mg total) by mouth daily. 90 capsule 1  . hydrochlorothiazide (HYDRODIURIL) 25 MG tablet Take 1 tablet (25 mg total) by mouth daily. 30 tablet 5  . losartan (COZAAR) 50 MG tablet Take 1 tablet (50 mg total) by mouth daily. 30 tablet 5  . pravastatin (PRAVACHOL) 40 MG tablet Take 1 tablet (40 mg total) by mouth daily. 90 tablet 1   No facility-administered medications prior to visit.    Allergies  Allergen Reactions  . Haldol [Haloperidol Lactate] Anaphylaxis and Shortness Of Breath  . Amlodipine Other (See Comments)    Nausea    ROS Review of Systems  Endocrine: Positive for polyphagia.  All other systems reviewed and are negative.     Objective:    Physical Exam  Constitutional: He is oriented to person, place, and time. He appears well-developed and well-nourished.  Morbid obesity   HENT:  Head: Normocephalic.  Cardiovascular: Normal rate and regular rhythm.  Pulmonary/Chest: Effort normal and breath sounds normal.  Abdominal: Bowel sounds are normal.  Musculoskeletal:        General: Normal range of motion.     Cervical back: Normal range of motion and neck supple.  Neurological: He is oriented to person, place, and time.  Skin: Skin is warm and dry.  Psychiatric: He has a normal mood and affect. His behavior is normal. Judgment and thought content normal.    BP 131/87 (BP Location: Left Arm, Patient Position: Sitting, Cuff Size: Large)   Pulse 79   Temp (!) 97.3 F  (36.3 C) (Temporal)   Ht 5' 4.5" (1.638 m)   Wt 262 lb 12.8 oz (119.2 kg)   SpO2 96%   BMI 44.41 kg/m  Wt Readings from Last 3 Encounters:  03/06/19 262 lb 12.8 oz (119.2 kg)  01/23/19 262 lb 9.6 oz (119.1 kg)  12/26/18 263 lb 6.4 oz (119.5 kg)     There are no preventive care reminders to display for this patient.  There are no preventive care reminders to display for this patient.  Lab Results  Component Value Date   TSH 0.822 11/28/2017   Lab Results  Component Value Date   WBC 9.1 12/26/2018   HGB 15.0 12/26/2018   HCT 46.2 12/26/2018   MCV 84 12/26/2018   PLT 332 12/26/2018   Lab Results  Component Value Date   NA 141 12/26/2018   K 4.8 12/26/2018   CO2 22 12/26/2018   GLUCOSE 102 (H) 12/26/2018   BUN 12 12/26/2018   CREATININE 0.84 12/26/2018   BILITOT 0.2 12/26/2018   ALKPHOS 76 12/26/2018   AST 27 12/26/2018   ALT 36 12/26/2018   PROT 7.8 12/26/2018   ALBUMIN 5.1 (H) 12/26/2018   CALCIUM 9.7 12/26/2018   ANIONGAP 10 11/30/2017   Lab Results  Component Value Date   CHOL 187 12/26/2018   Lab Results  Component Value Date   HDL 39 (L) 12/26/2018   Lab Results  Component Value Date   LDLCALC 107 (H) 12/26/2018   Lab Results  Component Value Date   TRIG 240 (H) 12/26/2018   Lab Results  Component Value Date   CHOLHDL 4.8 12/26/2018   No results found for: HGBA1C    Assessment & Plan:  Areg was seen today for blood pressure check.  Diagnoses and all orders for this visit:  Essential hypertension Blood pressure improving taking medication as prescribe notice a difference and feeling better. Goal is 130/80 and encourage weight loss that will also assist in lowering Bp. Continue a low-sodium, DASH diet, medication compliance, 150 minutes of moderate intensity exercise per week. Discussed medication compliance, adverse effects. -     diltiazem (CARDIZEM CD) 120 MG 24 hr capsule; Take 1 capsule (120 mg total) by mouth daily. -      hydrochlorothiazide (HYDRODIURIL) 25 MG tablet; Take 1 tablet (25 mg total) by mouth daily. -     losartan (COZAAR) 50 MG tablet; Take 1 tablet (50 mg total) by mouth daily.  Hyperlipidemia, unspecified hyperlipidemia type Elevated cholesterol that can lead to heart attack and stroke. Continue to monitor fatty foods and fried foods . Take pravasatin as prescribe to help lower cholesterol panel -     pravastatin (PRAVACHOL) 40 MG tablet; Take 1 tablet (40 mg  total) by mouth daily.  Morbid obesity (HCC) Morbid Obesity is BMI >/= 40 indicating an excess in caloric intake or underlining conditions. This may lead to other co-morbidities. Lifestyle modifications of diet and exercise may reduce obesity.   Medication refill  diltiazem (CARDIZEM CD) 120 MG 24 hr capsule; Take 1 capsule (120 mg total) by mouth daily. -     hydrochlorothiazide (HYDRODIURIL) 25 MG tablet; Take 1 tablet (25 mg total) by mouth daily. -     losartan (COZAAR) 50 MG tablet; Take 1 tablet (50 mg total) by mouth daily. pravastatin (PRAVACHOL) 40 MG tablet; Take 1 tablet (40 mg total) by mouth daily.  Meds ordered this encounter  Medications  . diltiazem (CARDIZEM CD) 120 MG 24 hr capsule    Sig: Take 1 capsule (120 mg total) by mouth daily.    Dispense:  90 capsule    Refill:  1  . hydrochlorothiazide (HYDRODIURIL) 25 MG tablet    Sig: Take 1 tablet (25 mg total) by mouth daily.    Dispense:  90 tablet    Refill:  1  . losartan (COZAAR) 50 MG tablet    Sig: Take 1 tablet (50 mg total) by mouth daily.    Dispense:  90 tablet    Refill:  1  . pravastatin (PRAVACHOL) 40 MG tablet    Sig: Take 1 tablet (40 mg total) by mouth daily.    Dispense:  90 tablet    Refill:  1    Follow-up: Return in about 3 months (around 06/03/2019) for HTN in person .    Grayce Sessions, NP

## 2019-06-05 ENCOUNTER — Ambulatory Visit (INDEPENDENT_AMBULATORY_CARE_PROVIDER_SITE_OTHER): Payer: Self-pay | Admitting: Primary Care

## 2019-06-05 ENCOUNTER — Other Ambulatory Visit (INDEPENDENT_AMBULATORY_CARE_PROVIDER_SITE_OTHER): Payer: Self-pay | Admitting: Primary Care

## 2019-06-05 ENCOUNTER — Other Ambulatory Visit: Payer: Self-pay

## 2019-06-05 ENCOUNTER — Encounter (INDEPENDENT_AMBULATORY_CARE_PROVIDER_SITE_OTHER): Payer: Self-pay | Admitting: Primary Care

## 2019-06-05 DIAGNOSIS — I10 Essential (primary) hypertension: Secondary | ICD-10-CM

## 2019-06-05 DIAGNOSIS — E785 Hyperlipidemia, unspecified: Secondary | ICD-10-CM

## 2019-06-05 MED ORDER — PRAVASTATIN SODIUM 40 MG PO TABS: 40.0000 mg | ORAL_TABLET | Freq: Every day | ORAL | 1 refills | Status: DC

## 2019-06-05 MED ORDER — HYDROCHLOROTHIAZIDE 25 MG PO TABS: 25.0000 mg | ORAL_TABLET | Freq: Every day | ORAL | 1 refills | Status: DC

## 2019-06-05 MED ORDER — DILTIAZEM HCL ER COATED BEADS 120 MG PO CP24: 120.0000 mg | ORAL_CAPSULE | Freq: Every day | ORAL | 1 refills | Status: DC

## 2019-06-05 MED ORDER — LOSARTAN POTASSIUM 50 MG PO TABS: 50.0000 mg | ORAL_TABLET | Freq: Every day | ORAL | 1 refills | Status: DC

## 2019-06-05 NOTE — Patient Instructions (Signed)

## 2019-06-05 NOTE — Progress Notes (Signed)
[name] 39 year old male presents for  hypertension evaluation, on previous visit medication was adjusted made on previous visit, last visit he was out of medication and needed to refill despite he had refills.  Patient reports adherence with medications.  Current Medication List Current Outpatient Medications on File Prior to Visit  Medication Sig Dispense Refill  . diltiazem (CARDIZEM CD) 120 MG 24 hr capsule Take 1 capsule (120 mg total) by mouth daily. 90 capsule 1  . hydrochlorothiazide (HYDRODIURIL) 25 MG tablet Take 1 tablet (25 mg total) by mouth daily. 90 tablet 1  . losartan (COZAAR) 50 MG tablet Take 1 tablet (50 mg total) by mouth daily. 90 tablet 1  . pravastatin (PRAVACHOL) 40 MG tablet Take 1 tablet (40 mg total) by mouth daily. 90 tablet 1   No current facility-administered medications on file prior to visit.   Past Medical History  Past Medical History:  Diagnosis Date  . 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  Constitutional:      Appearance: He is obese.  HENT:     Head: Normocephalic.  Cardiovascular:     Rate and Rhythm: Normal rate and regular rhythm.  Pulmonary:     Effort: Pulmonary effort is normal.     Breath sounds: Normal breath sounds.  Abdominal:     General: Bowel sounds are normal.  Musculoskeletal:        General: Normal range of motion.     Cervical back: Normal range of motion and neck supple.  Skin:    General: Skin is warm and dry.  Neurological:     Mental Status: He is oriented to person, place, and time.  Psychiatric:        Mood and Affect: Mood normal.        Behavior: Behavior normal.        Thought Content: Thought content normal.        Judgment: Judgment normal.      Review of  Systems  All other systems reviewed and are negative.   Last 3 Office BP readings: BP Readings from Last 3 Encounters:  06/05/19 124/80  03/06/19 131/87  01/23/19 (!) 147/98    BMET    Component Value Date/Time   NA 141 12/26/2018 0947   K 4.8 12/26/2018 0947   CL 103 12/26/2018 0947   CO2 22 12/26/2018 0947   GLUCOSE 102 (H) 12/26/2018 0947   GLUCOSE 121 (H) 11/30/2017 0217   BUN 12 12/26/2018 0947   CREATININE 0.84 12/26/2018 0947   CALCIUM 9.7 12/26/2018 0947   GFRNONAA 110 12/26/2018 0947   GFRAA 127 12/26/2018 0947    Renal function: CrCl cannot be calculated (Patient's most recent lab result is older than the maximum 21 days allowed.).  Clinical ASCVD: Yes  The 10-year ASCVD risk score Mikey Bussing DC Jr., et al., 2013) is: 5.1%   Values used to calculate the score:     Age: 80 years     Sex: Male     Is Non-Hispanic African American: Yes     Diabetic: No     Tobacco smoker: No     Systolic Blood Pressure: 850 mmHg     Is BP treated: Yes     HDL Cholesterol: 39 mg/dL  Total Cholesterol: 187 mg/dL   A/P: Essential hypertension Hypertension longstanding diagnosed currently  on current dilitiazem 120mg /24hr  medications. BP Goal = 130/80 mmHg. Patient is adherent with current medications.  -Continued  -F/u labs ordered - CMP, Lipid, CBC -Counseled on lifestyle modifications for blood pressure control including reduced dietary sodium, increased exercise, adequate sleep  Hyperlipidemia, unspecified hyperlipidemia type He will need to follow up for fasting lipids. Decrease your fatty foods, red meat, cheese, milk and increase fiber like whole grains and veggies.  Continue pravastatin 40 mg at bedtime.

## 2019-07-06 ENCOUNTER — Other Ambulatory Visit: Payer: Self-pay

## 2019-07-06 ENCOUNTER — Other Ambulatory Visit (INDEPENDENT_AMBULATORY_CARE_PROVIDER_SITE_OTHER): Payer: Medicaid Other

## 2019-07-06 DIAGNOSIS — E785 Hyperlipidemia, unspecified: Secondary | ICD-10-CM

## 2019-07-07 LAB — LIPID PANEL
Chol/HDL Ratio: 5.4 ratio — ABNORMAL HIGH (ref 0.0–5.0)
Cholesterol, Total: 177 mg/dL (ref 100–199)
HDL: 33 mg/dL — ABNORMAL LOW (ref >39)
LDL Chol Calc (NIH): 91 mg/dL (ref 0–99)
Triglycerides: 318 mg/dL — ABNORMAL HIGH (ref 0–149)
VLDL Cholesterol Cal: 53 mg/dL — ABNORMAL HIGH (ref 5–40)

## 2020-06-21 ENCOUNTER — Other Ambulatory Visit (INDEPENDENT_AMBULATORY_CARE_PROVIDER_SITE_OTHER): Payer: Self-pay | Admitting: Primary Care

## 2020-06-21 DIAGNOSIS — E785 Hyperlipidemia, unspecified: Secondary | ICD-10-CM

## 2020-06-21 DIAGNOSIS — I10 Essential (primary) hypertension: Secondary | ICD-10-CM

## 2020-06-21 NOTE — Telephone Encounter (Signed)
Requested medication (s) are due for refill today: expired medications  Requested medication (s) are on the active medication list: yes  Last refill:  06/05/19 #90 1 refill  Future visit scheduled: yes in 3 days  Notes to clinic:  expired medications. Can patient have 90 days supply refill today ?     Requested Prescriptions  Pending Prescriptions Disp Refills   pravastatin (PRAVACHOL) 40 MG tablet [Pharmacy Med Name: Pravastatin Sodium 40MG  TABS] 30 tablet     Sig: Take 1 tablet (40 mg total) by mouth daily.      Cardiovascular:  Antilipid - Statins Failed - 06/21/2020  1:43 PM      Failed - HDL in normal range and within 360 days    HDL  Date Value Ref Range Status  07/06/2019 33 (L) >39 mg/dL Final          Failed - Triglycerides in normal range and within 360 days    Triglycerides  Date Value Ref Range Status  07/06/2019 318 (H) 0 - 149 mg/dL Final          Failed - Valid encounter within last 12 months    Recent Outpatient Visits           1 year ago Essential hypertension   [facility] 07/08/2019, NP   1 year ago Essential hypertension   [facility] Grayce Sessions, NP   1 year ago Essential hypertension   [facility] Grayce Sessions, NP   1 year ago Essential hypertension   [facility], NP   3 years ago Blood pressure check   [facility], PA-C       Future Appointments             In 3 days Loletta Specter, NP [facility]             Passed - Total Cholesterol in normal range and within 360 days    Cholesterol, Total  Date Value Ref Range Status  07/06/2019 177 100 - 199 mg/dL Final          Passed - LDL in normal range and within 360 days    LDL Chol Calc (NIH)  Date Value Ref Range Status  07/06/2019 91 0 - 99 mg/dL Final          Passed -  Patient is not pregnant        diltiazem (CARDIZEM CD) 120 MG 24 hr capsule [Pharmacy Med Name: dilTIAZem HCl ER Coated Beads 120MG  CP24*] 30 capsule     Sig: Take 1 capsule (120 mg total) by mouth daily.      Cardiovascular:  Calcium Channel Blockers Failed - 06/21/2020  1:43 PM      Failed - Valid encounter within last 6 months    Recent Outpatient Visits           1 year ago Essential hypertension   [facility] , NP   1 year ago Essential hypertension   [facility] Grayce Sessions, NP   1 year ago Essential hypertension   [facility] Grayce Sessions, NP   1 year ago Essential hypertension   [facility] Grayce Sessions, NP   3 years ago Blood pressure check   [facility] Loletta Specter, PA-C       Future Appointments             In 3 days Grayce Sessions, NP [facility]             Passed - Last BP in normal range    BP Readings from Last 1 Encounters:  06/05/19 124/80            hydrochlorothiazide (HYDRODIURIL) 25 MG tablet [Pharmacy Med Name: hydroCHLOROthiazide 25MG  TABS*] 30 tablet     Sig: Take 1 tablet (25 mg total) by mouth daily.      Cardiovascular: Diuretics - Thiazide Failed - 06/21/2020  1:43 PM      Failed - Ca in normal range and within 360 days    Calcium  Date Value Ref Range Status  12/26/2018 9.7 8.7 - 10.2 mg/dL Final          Failed - Cr in normal range and within 360 days    Creatinine, Ser  Date Value Ref Range Status  12/26/2018 0.84 0.76 - 1.27 mg/dL Final          Failed - K in normal range and within 360 days    Potassium  Date Value Ref Range Status  12/26/2018 4.8 3.5 - 5.2 mmol/L Final          Failed - Na in normal range and within 360 days    Sodium  Date Value Ref Range Status  12/26/2018 141 134 - 144 mmol/L Final          Failed -  Valid encounter within last 6 months    Recent Outpatient Visits           1 year ago Essential hypertension   [facility] 12/28/2018, NP   1 year ago Essential hypertension   [facility], NP   1 year ago Essential hypertension   [facility], NP   1 year ago Essential hypertension   [facility], NP   3 years ago Blood pressure check   [facility], PA-C       Future Appointments             In 3 days Loletta Specter, NP [facility]             Passed - Last BP in normal range    BP Readings from Last 1 Encounters:  06/05/19 124/80            losartan (COZAAR) 50 MG tablet [Pharmacy Med Name: Losartan Potassium 50MG  TABS] 30 tablet     Sig: Take 1 tablet (50 mg total) by mouth daily.      Cardiovascular:  Angiotensin Receptor Blockers Failed - 06/21/2020  1:43 PM      Failed - Cr in normal range and within 180 days    Creatinine, Ser  Date Value Ref Range Status  12/26/2018 0.84 0.76 - 1.27 mg/dL Final          Failed - K in normal range and within 180 days    Potassium  Date Value Ref Range Status  12/26/2018 4.8 3.5 - 5.2 mmol/L Final          Failed - Valid encounter within last 6 months  Recent Outpatient Visits           1 year ago Essential hypertension   [facility] Grayce Sessions, NP   1 year ago Essential hypertension   [facility] Grayce Sessions, NP   1 year ago Essential hypertension   [facility] Grayce Sessions, NP   1 year ago Essential hypertension   [facility] Grayce Sessions, NP   3 years ago Blood pressure check   [facility] Loletta Specter, PA-C       Future  Appointments             In 3 days Grayce Sessions, NP [facility]             Passed - Patient is not pregnant      Passed - Last BP in normal range    BP Readings from Last 1 Encounters:  06/05/19 124/80

## 2020-06-24 ENCOUNTER — Ambulatory Visit (INDEPENDENT_AMBULATORY_CARE_PROVIDER_SITE_OTHER): Payer: Medicaid Other | Admitting: Primary Care

## 2020-07-10 ENCOUNTER — Ambulatory Visit (INDEPENDENT_AMBULATORY_CARE_PROVIDER_SITE_OTHER): Payer: Medicaid Other | Admitting: Primary Care

## 2020-10-16 ENCOUNTER — Ambulatory Visit: Payer: Medicaid Other | Admitting: Family Medicine

## 2020-11-01 ENCOUNTER — Other Ambulatory Visit: Payer: Self-pay

## 2020-11-01 ENCOUNTER — Encounter: Payer: Self-pay | Admitting: Family Medicine

## 2020-11-01 ENCOUNTER — Ambulatory Visit (INDEPENDENT_AMBULATORY_CARE_PROVIDER_SITE_OTHER): Payer: Self-pay

## 2020-11-01 ENCOUNTER — Ambulatory Visit (INDEPENDENT_AMBULATORY_CARE_PROVIDER_SITE_OTHER): Payer: Medicaid Other

## 2020-11-01 ENCOUNTER — Ambulatory Visit (INDEPENDENT_AMBULATORY_CARE_PROVIDER_SITE_OTHER): Payer: Self-pay | Admitting: Family Medicine

## 2020-11-01 VITALS — BP 146/95 | HR 80 | Temp 98.4°F | Ht 64.5 in | Wt 259.0 lb

## 2020-11-01 DIAGNOSIS — F602 Antisocial personality disorder: Secondary | ICD-10-CM | POA: Insufficient documentation

## 2020-11-01 DIAGNOSIS — G8929 Other chronic pain: Secondary | ICD-10-CM

## 2020-11-01 DIAGNOSIS — I10 Essential (primary) hypertension: Secondary | ICD-10-CM

## 2020-11-01 DIAGNOSIS — M545 Low back pain, unspecified: Secondary | ICD-10-CM

## 2020-11-01 DIAGNOSIS — E782 Mixed hyperlipidemia: Secondary | ICD-10-CM

## 2020-11-01 DIAGNOSIS — M25521 Pain in right elbow: Secondary | ICD-10-CM

## 2020-11-01 MED ORDER — LOSARTAN POTASSIUM 25 MG PO TABS: 25.0000 mg | ORAL_TABLET | Freq: Every day | ORAL | 1 refills | Status: DC

## 2020-11-01 MED ORDER — PREDNISONE 20 MG PO TABS: 40.0000 mg | ORAL_TABLET | Freq: Every day | ORAL | 0 refills | Status: AC

## 2020-11-01 NOTE — Patient Instructions (Signed)
DASH Eating Plan DASH stands for "Dietary Approaches to Stop Hypertension." The DASH eating plan is a healthy eating plan that has been shown to reduce high blood pressure (hypertension). Additional health benefits may include reducing the risk of type 2 diabetes mellitus, heart disease, and stroke. The DASH eating plan may also help with weight loss.  WHAT DO I NEED TO KNOW ABOUT THE DASH EATING PLAN? For the DASH eating plan, you will follow these general guidelines:  Choose foods with a percent daily value for sodium of less than 5% (as listed on the food label).  Use salt-free seasonings or herbs instead of table salt or sea salt.  Check with your health care provider or pharmacist before using salt substitutes.  Eat lower-sodium products, often labeled as "lower sodium" or "no salt added."  Eat fresh foods.  Eat more vegetables, fruits, and low-fat dairy products.  Choose whole grains. Look for the word "whole" as the first word in the ingredient list.  Choose fish and skinless chicken or turkey more often than red meat. Limit fish, poultry, and meat to 6 oz (170 g) each day.  Limit sweets, desserts, sugars, and sugary drinks.  Choose heart-healthy fats.  Limit cheese to 1 oz (28 g) per day.  Eat more home-cooked food and less restaurant, buffet, and fast food.  Limit fried foods.  Cook foods using methods other than frying.  Limit canned vegetables. If you do use them, rinse them well to decrease the sodium.  When eating at a restaurant, ask that your food be prepared with less salt, or no salt if possible.  WHAT FOODS CAN I EAT? Seek help from a dietitian for individual calorie needs.  Grains Whole grain or whole wheat bread. Brown rice. Whole grain or whole wheat pasta. Quinoa, bulgur, and whole grain cereals. Low-sodium cereals. Corn or whole wheat flour tortillas. Whole grain cornbread. Whole grain crackers. Low-sodium crackers.  Vegetables Fresh or frozen  vegetables (raw, steamed, roasted, or grilled). Low-sodium or reduced-sodium tomato and vegetable juices. Low-sodium or reduced-sodium tomato sauce and paste. Low-sodium or reduced-sodium canned vegetables.   Fruits All fresh, canned (in natural juice), or frozen fruits.  Meat and Other Protein Products Ground beef (85% or leaner), grass-fed beef, or beef trimmed of fat. Skinless chicken or turkey. Ground chicken or turkey. Pork trimmed of fat. All fish and seafood. Eggs. Dried beans, peas, or lentils. Unsalted nuts and seeds. Unsalted canned beans.  Dairy Low-fat dairy products, such as skim or 1% milk, 2% or reduced-fat cheeses, low-fat ricotta or cottage cheese, or plain low-fat yogurt. Low-sodium or reduced-sodium cheeses.  Fats and Oils Tub margarines without trans fats. Light or reduced-fat mayonnaise and salad dressings (reduced sodium). Avocado. Safflower, olive, or canola oils. Natural peanut or almond butter.  Other Unsalted popcorn and pretzels. The items listed above may not be a complete list of recommended foods or beverages. Contact your dietitian for more options.  WHAT FOODS ARE NOT RECOMMENDED?  Grains White bread. White pasta. White rice. Refined cornbread. Bagels and croissants. Crackers that contain trans fat.  Vegetables Creamed or fried vegetables. Vegetables in a cheese sauce. Regular canned vegetables. Regular canned tomato sauce and paste. Regular tomato and vegetable juices.  Fruits Dried fruits. Canned fruit in light or heavy syrup. Fruit juice.  Meat and Other Protein Products Fatty cuts of meat. Ribs, chicken wings, bacon, sausage, bologna, salami, chitterlings, fatback, hot dogs, bratwurst, and packaged luncheon meats. Salted nuts and seeds. Canned beans with salt.    Dairy Whole or 2% milk, cream, half-and-half, and cream cheese. Whole-fat or sweetened yogurt. Full-fat cheeses or blue cheese. Nondairy creamers and whipped toppings. Processed cheese,  cheese spreads, or cheese curds.  Condiments Onion and garlic salt, seasoned salt, table salt, and sea salt. Canned and packaged gravies. Worcestershire sauce. Tartar sauce. Barbecue sauce. Teriyaki sauce. Soy sauce, including reduced sodium. Steak sauce. Fish sauce. Oyster sauce. Cocktail sauce. Horseradish. Ketchup and mustard. Meat flavorings and tenderizers. Bouillon cubes. Hot sauce. Tabasco sauce. Marinades. Taco seasonings. Relishes.  Fats and Oils Butter, stick margarine, lard, shortening, ghee, and bacon fat. Coconut, palm kernel, or palm oils. Regular salad dressings.  Other Pickles and olives. Salted popcorn and pretzels.  The items listed above may not be a complete list of foods and beverages to avoid. Contact your dietitian for more information.  WHERE CAN I FIND MORE INFORMATION? National Heart, Lung, and Blood Institute: www.nhlbi.nih.gov/health/health-topics/topics/dash/ Document Released: 12/18/2010 Document Revised: 05/15/2013 Document Reviewed: 11/02/2012 ExitCare Patient Information 2015 [employer]. This information is not intended to replace advice given to you by your health care provider. Make sure you discuss any questions you have with your health care provider.   I think that you would greatly benefit from seeing a nutritionist.  If you are interested, please call Dr Sykes at [phone] to schedule an appointment.   

## 2020-11-01 NOTE — Progress Notes (Signed)
Subjective:  Patient ID: Steven Houston, male    DOB: 1980-07-10, 40 y.o.   MRN: [id number]  Patient Care Team: Baruch Gouty, FNP as PCP - General (Family Medicine)   Chief Complaint:  New Patient (Initial Visit) (Right elbow pain/Right side back pain/FMLA form)   HPI: Steven Houston is a 40 y.o. male presenting on 11/01/2020 for New Patient (Initial Visit) (Right elbow pain/Right side back pain/FMLA form)   Pt presents today to establish care with new PCP. He was formerly followed by Juluis Mire, NP, in [place]. He states he recently relocated to the area and needs a provider closer to home. He has not been seen by his PCP since 05/2019. He has a noted history history of hypertension, hyperlipidemia, bipolar disorder, antisocial personality disorder, cocaine abuse, prior suicide attempts, and malingering. When questioned about prior medical history pt only admits to hypertension and hyperlipidemia. He has also had a cataract removed from left eye.  He stopped taking his medications over 8 months ago. States he has changed his diet and started exercising to help control blood pressure. He does state over the last several months he has noticed some blurred vision at times, states his blood pressure is elevated during these episodes.  His biggest concern today is right elbow and lower back pain. States this has been going on for several months. No known injury. 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 He states he was told by his employer to get FMLA paperwork forms filled out by his PCP. Pt aware this would not be done today as workup would be needed to determine cause of symptoms. He states the pain is intermittent and worse when operating the forklift. He uses topical Aspercreme with relief of symptoms.    Relevant past medical, surgical, family, and social history reviewed and updated as indicated.  Allergies and medications reviewed and updated. Data reviewed:  Chart in Epic.   Past Medical History:  Diagnosis Date   Bipolar disorder ([place])    Hyperlipidemia    Hypertension     History reviewed. No pertinent surgical history.  Social History   Socioeconomic History   Marital status: Single    Spouse name: Not on file   Number of children: Not on file   Years of education: Not on file   Highest education level: Not on file  Occupational History   Not on file  Tobacco Use   Smoking status: Never   Smokeless tobacco: Never  Substance and Sexual Activity   Alcohol use: Not Currently   Drug use: Not Currently   Sexual activity: Not on file  Other Topics Concern   Not on file  Social History Narrative   Not on file   Social Determinants of Health   Financial Resource Strain: Not on file  Food Insecurity: Not on file  Transportation Needs: Not on file  Physical Activity: Not on file  Stress: Not on file  Social Connections: Not on file  Intimate Partner Violence: Not on file    Outpatient Encounter Medications as of 11/01/2020  Medication Sig   losartan (COZAAR) 25 MG tablet Take 1 tablet (25 mg total) by mouth daily.   [DISCONTINUED] diltiazem (CARDIZEM CD) 120 MG 24 hr capsule Take 1 capsule (120 mg total) by mouth daily. (Patient not taking: Reported on 11/01/2020)   [DISCONTINUED] hydrochlorothiazide (HYDRODIURIL) 25 MG tablet Take 1 tablet (25 mg total) by mouth daily. (Patient not taking: Reported on 11/01/2020)   [  DISCONTINUED] losartan (COZAAR) 50 MG tablet Take 1 tablet (50 mg total) by mouth daily. (Patient not taking: Reported on 11/01/2020)   [DISCONTINUED] pravastatin (PRAVACHOL) 40 MG tablet Take 1 tablet (40 mg total) by mouth daily. (Patient not taking: Reported on 11/01/2020)   No facility-administered encounter medications on file as of 11/01/2020.    Allergies  Allergen Reactions   Haldol [Haloperidol Lactate] Anaphylaxis and Shortness Of Breath   Amlodipine Other (See Comments)    Nausea    Review  of Systems  Constitutional:  Negative for activity change, appetite change, chills, diaphoresis, fatigue, fever and unexpected weight change.  HENT: Negative.    Eyes:  Positive for visual disturbance.  Respiratory:  Negative for cough, chest tightness and shortness of breath.   Cardiovascular:  Negative for chest pain, palpitations and leg swelling.  Gastrointestinal:  Negative for abdominal pain, blood in stool, constipation, diarrhea, nausea and vomiting.  Endocrine: Negative.  Negative for cold intolerance, heat intolerance, polydipsia, polyphagia and polyuria.  Genitourinary:  Negative for decreased urine volume, difficulty urinating, dysuria, frequency and urgency.  Musculoskeletal:  Positive for arthralgias and back pain. Negative for gait problem, joint swelling, myalgias, neck pain and neck stiffness.  Skin: Negative.   Allergic/Immunologic: Negative.   Neurological:  Negative for dizziness, tremors, seizures, syncope, facial asymmetry, speech difficulty, weakness, light-headedness, numbness and headaches.  Hematological: Negative.   Psychiatric/Behavioral:  Negative for agitation, behavioral problems, confusion, decreased concentration, dysphoric mood, hallucinations, self-injury, sleep disturbance and suicidal ideas. The patient is not nervous/anxious and is not hyperactive.   All other systems reviewed and are negative.      Objective:  BP (!) 146/95   Pulse 80   Temp 98.4 F (36.9 C)   Ht 5' 4.5" (1.638 m)   Wt 259 lb (117.5 kg)   SpO2 96%   BMI 43.77 kg/m    Wt Readings from Last 3 Encounters:  11/01/20 259 lb (117.5 kg)  06/05/19 259 lb 6.4 oz (117.7 kg)  03/06/19 262 lb 12.8 oz (119.2 kg)    Physical Exam Vitals and nursing note reviewed.  Constitutional:      General: He is not in acute distress.    Appearance: Normal appearance. He is well-developed and well-groomed. He is morbidly obese. He is not ill-appearing, toxic-appearing or diaphoretic.  HENT:      Head: Normocephalic and atraumatic.     Jaw: There is normal jaw occlusion.     Right Ear: Hearing normal.     Left Ear: Hearing normal.     Nose: Nose normal.     Mouth/Throat:     Lips: Pink.     Mouth: Mucous membranes are moist.     Pharynx: Oropharynx is clear. Uvula midline.  Eyes:     General: Lids are normal.     Extraocular Movements: Extraocular movements intact.     Conjunctiva/sclera: Conjunctivae normal.     Pupils: Pupils are equal, round, and reactive to light.  Neck:     Thyroid: No thyroid mass, thyromegaly or thyroid tenderness.     Vascular: No carotid bruit or JVD.     Trachea: Trachea and phonation normal.  Cardiovascular:     Rate and Rhythm: Normal rate and regular rhythm.     Chest Wall: PMI is not displaced.     Pulses: Normal pulses.     Heart sounds: Normal heart sounds. No murmur heard.   No friction rub. No gallop.  Pulmonary:     Effort: Pulmonary effort  is normal. No respiratory distress.     Breath sounds: Normal breath sounds. No wheezing.  Abdominal:     General: Bowel sounds are normal. There is no distension or abdominal bruit.     Palpations: Abdomen is soft. There is no hepatomegaly or splenomegaly.     Tenderness: There is no abdominal tenderness. There is no right CVA tenderness or left CVA tenderness.     Hernia: No hernia is present.  Musculoskeletal:        General: No swelling, deformity or signs of injury. Normal range of motion.     Right upper arm: Normal.     Left upper arm: Normal.     Right elbow: Normal.     Left elbow: Normal.     Right forearm: Normal.     Left forearm: Normal.     Cervical back: Normal range of motion and neck supple.     Thoracic back: Normal.     Lumbar back: Tenderness present. No swelling, edema, deformity, signs of trauma, lacerations, spasms or bony tenderness. Normal range of motion. Negative right straight leg raise test and negative left straight leg raise test. No scoliosis.     Right lower  leg: No edema.     Left lower leg: No edema.  Lymphadenopathy:     Cervical: No cervical adenopathy.  Skin:    General: Skin is warm and dry.     Capillary Refill: Capillary refill takes less than 2 seconds.     Coloration: Skin is not cyanotic, jaundiced or pale.     Findings: No rash.  Neurological:     General: No focal deficit present.     Mental Status: He is alert and oriented to person, place, and time.     Cranial Nerves: No cranial nerve deficit.     Sensory: Sensation is intact. No sensory deficit.     Motor: Motor function is intact. No weakness.     Coordination: Coordination is intact. Coordination normal.     Gait: Gait is intact. Gait normal.     Deep Tendon Reflexes: Reflexes are normal and symmetric. Reflexes normal.  Psychiatric:        Attention and Perception: Attention and perception normal.        Mood and Affect: Mood and affect normal.        Speech: Speech normal.        Behavior: Behavior normal. Behavior is cooperative.        Thought Content: Thought content normal.        Cognition and Memory: Cognition and memory normal.        Judgment: Judgment normal.     Comments: Hyperverbal    Results for orders placed or performed in visit on 07/06/19  Lipid panel  Result Value Ref Range   Cholesterol, Total 177 100 - 199 mg/dL   Triglycerides 318 (H) 0 - 149 mg/dL   HDL 33 (L) >39 mg/dL   VLDL Cholesterol Cal 53 (H) 5 - 40 mg/dL   LDL Chol Calc (NIH) 91 0 - 99 mg/dL   Chol/HDL Ratio 5.4 (H) 0.0 - 5.0 ratio       Pertinent labs & imaging results that were available during my care of the patient were reviewed by me and considered in my medical decision making.  Assessment & Plan:  Franky was seen today for new patient (initial visit).  Diagnoses and all orders for this visit:  Essential hypertension BP not at goal, will  restart losartan today. DASH diet and exercise encouraged. Labs pending. Follow up in 4 weeks for reevaluation.  -     CBC with  Differential/Platelet -     CMP14+EGFR -     Lipid panel -     Thyroid Panel With TSH -     losartan (COZAAR) 25 MG tablet; Take 1 tablet (25 mg total) by mouth daily.  Mixed hyperlipidemia Will check lipid panel and restart statin therapy if warranted. Diet and exercise discussed in detail.  -     Lipid panel  Morbid obesity ([place]) Diet and exercise discussed in detail. Labs pending. -     CBC with Differential/Platelet -     CMP14+EGFR -     Lipid panel -     Thyroid Panel With TSH  Chronic bilateral low back pain without sciatica Imaging ordered. No red flags present concerning for Cauda Equina syndrome. Symptomatic care discussed in detail, topicals and Tylenol, avoid NSAIDs due to HTN. Will burst with steroids. -     DG Lumbar Spine 2-3 Views  Right elbow pain Imaging ordered. Symptomatic care discussed in detail, topicals and Tylenol, avoid NSAIDs due to HTN. Will burst with steroids. -     DG Elbow 2 Views Right    Continue all other maintenance medications.  Follow up plan: Return in about 4 weeks (around 11/29/2020), or if symptoms worsen or fail to improve, for HTN.   Continue healthy lifestyle choices, including diet (rich in fruits, vegetables, and lean proteins, and low in salt and simple carbohydrates) and exercise (at least 30 minutes of moderate physical activity daily).  Educational handout given for DASH diet  The above assessment and management plan was discussed with the patient. The patient verbalized understanding of and has agreed to the management plan. Patient is aware to call the clinic if they develop any new symptoms or if symptoms persist or worsen. Patient is aware when to return to the clinic for a follow-up visit. Patient educated on when it is appropriate to go to the emergency department.   Monia Pouch, FNP-C [facility] [phone]

## 2020-11-02 LAB — CMP14+EGFR
ALT: 31 IU/L (ref 0–44)
AST: 23 IU/L (ref 0–40)
Albumin/Globulin Ratio: 1.8 (ref 1.2–2.2)
Albumin: 4.8 g/dL (ref 4.0–5.0)
Alkaline Phosphatase: 74 IU/L (ref 44–121)
BUN/Creatinine Ratio: 24 — ABNORMAL HIGH (ref 9–20)
BUN: 18 mg/dL (ref 6–24)
Bilirubin Total: 0.2 mg/dL (ref 0.0–1.2)
CO2: 24 mmol/L (ref 20–29)
Calcium: 9.6 mg/dL (ref 8.7–10.2)
Chloride: 102 mmol/L (ref 96–106)
Creatinine, Ser: 0.74 mg/dL — ABNORMAL LOW (ref 0.76–1.27)
Globulin, Total: 2.7 g/dL (ref 1.5–4.5)
Glucose: 97 mg/dL (ref 70–99)
Potassium: 4.5 mmol/L (ref 3.5–5.2)
Sodium: 140 mmol/L (ref 134–144)
Total Protein: 7.5 g/dL (ref 6.0–8.5)
eGFR: 116 mL/min/{1.73_m2} (ref >59)

## 2020-11-02 LAB — THYROID PANEL WITH TSH
Free Thyroxine Index: 2 (ref 1.2–4.9)
T3 Uptake Ratio: 33 % (ref 24–39)
T4, Total: 6 ug/dL (ref 4.5–12.0)
TSH: 1.59 u[IU]/mL (ref 0.450–4.500)

## 2020-11-02 LAB — CBC WITH DIFFERENTIAL/PLATELET
Basophils Absolute: 0 10*3/uL (ref 0.0–0.2)
Basos: 0 %
EOS (ABSOLUTE): 0.1 10*3/uL (ref 0.0–0.4)
Eos: 1 %
Hematocrit: 43.8 % (ref 37.5–51.0)
Hemoglobin: 14.2 g/dL (ref 13.0–17.7)
Immature Grans (Abs): 0 10*3/uL (ref 0.0–0.1)
Immature Granulocytes: 1 %
Lymphocytes Absolute: 2.2 10*3/uL (ref 0.7–3.1)
Lymphs: 29 %
MCH: 27.1 pg (ref 26.6–33.0)
MCHC: 32.4 g/dL (ref 31.5–35.7)
MCV: 84 fL (ref 79–97)
Monocytes Absolute: 0.7 10*3/uL (ref 0.1–0.9)
Monocytes: 9 %
Neutrophils Absolute: 4.5 10*3/uL (ref 1.4–7.0)
Neutrophils: 60 %
Platelets: 272 10*3/uL (ref 150–450)
RBC: 5.24 x10E6/uL (ref 4.14–5.80)
RDW: 13.6 % (ref 11.6–15.4)
WBC: 7.6 10*3/uL (ref 3.4–10.8)

## 2020-11-02 LAB — LIPID PANEL
Chol/HDL Ratio: 5.3 ratio — ABNORMAL HIGH (ref 0.0–5.0)
Cholesterol, Total: 179 mg/dL (ref 100–199)
HDL: 34 mg/dL — ABNORMAL LOW (ref >39)
LDL Chol Calc (NIH): 117 mg/dL — ABNORMAL HIGH (ref 0–99)
Triglycerides: 157 mg/dL — ABNORMAL HIGH (ref 0–149)
VLDL Cholesterol Cal: 28 mg/dL (ref 5–40)

## 2020-11-13 ENCOUNTER — Other Ambulatory Visit: Payer: Self-pay | Admitting: Family Medicine

## 2020-11-13 ENCOUNTER — Telehealth: Payer: Self-pay | Admitting: Family Medicine

## 2020-11-13 DIAGNOSIS — M25521 Pain in right elbow: Secondary | ICD-10-CM | POA: Insufficient documentation

## 2020-11-13 DIAGNOSIS — G8929 Other chronic pain: Secondary | ICD-10-CM | POA: Insufficient documentation

## 2020-11-13 DIAGNOSIS — M545 Low back pain, unspecified: Secondary | ICD-10-CM | POA: Insufficient documentation

## 2020-11-13 NOTE — Telephone Encounter (Signed)
Pt is checking on his FMLA he dropped off 11/01/2020.  He wants his xray results. He wants a letter to with work restrictions on lifting heavy objects because elbow is still hurting. Please call back

## 2020-11-13 NOTE — Telephone Encounter (Signed)
Steven Masters, FNP    I will place a referral to ortho due to ongoing pain. We can place on restriction with no heavy lifting until evaluated by ortho.

## 2020-11-13 NOTE — Telephone Encounter (Signed)
Please advise 

## 2020-11-14 NOTE — Telephone Encounter (Signed)
Letter written and given to patient

## 2020-11-14 NOTE — Telephone Encounter (Signed)
Per Steven Houston ppw is not going to be completed at this time until he sees the Orthopedist, this is not a continuous leave or an intermittent leave just a restrictions of no heavy lifting. Pt aware when he picked up the restrictions letter.

## 2020-11-25 ENCOUNTER — Encounter: Payer: Self-pay | Admitting: Orthopedic Surgery

## 2020-11-25 ENCOUNTER — Other Ambulatory Visit: Payer: Self-pay

## 2020-11-25 ENCOUNTER — Ambulatory Visit (INDEPENDENT_AMBULATORY_CARE_PROVIDER_SITE_OTHER): Payer: Self-pay | Admitting: Orthopedic Surgery

## 2020-11-25 VITALS — BP 168/120 | HR 83 | Ht 64.5 in | Wt 256.4 lb

## 2020-11-25 DIAGNOSIS — M7711 Lateral epicondylitis, right elbow: Secondary | ICD-10-CM

## 2020-11-25 NOTE — Progress Notes (Signed)
New Patient Visit  Assessment: Steven Houston is a 40 y.o. male with the following: 1. Lateral epicondylitis, right elbow  Plan: Patient's presentation is most consistent with lateral epicondylitis of the right elbow.  He notes that his symptoms are gradually improved with Aspercreme.  Also suggested he could try Voltaren gel.  He is interested in a counterforce brace, and this was provided for him today.  He was also given some home exercises to initiate immediately.  If his symptoms do not continue to improve, we can consider an injection in the future.  Follow-up as needed.   Follow-up: Return if symptoms worsen or fail to improve.  Subjective:  Chief Complaint  Patient presents with   Elbow Pain    RT No know injury    History of Present Illness: Steven Houston is a 40 y.o. RHD male who has been referred to clinic today by Gilford Silvius, FNP for evaluation of right elbow pain.  He has had pain in the lateral right elbow for the past 6+ months.  He states his pain is gradually improving.  Occasional medications.  He is using Aspercreme on his elbow.  No specific injury.  He notes a lot of heavy, and repetitive lifting at work.  He has not worked with physical therapy.  No prior injections.   Review of Systems: No fevers or chills No numbness or tingling No chest pain No shortness of breath No bowel or bladder dysfunction No GI distress No headaches   Medical History:  Past Medical History:  Diagnosis Date   Bipolar disorder ([facility])    Hyperlipidemia    Hypertension     No past surgical history on file.  No family history on file. Social History   Tobacco Use   Smoking status: Never   Smokeless tobacco: Never  Substance Use Topics   Alcohol use: Not Currently   Drug use: Not Currently    Allergies  Allergen Reactions   Haldol [Haloperidol Lactate] Anaphylaxis and Shortness Of Breath   Amlodipine Other (See Comments)    Nausea    Current Meds  Medication Sig    losartan (COZAAR) 25 MG tablet Take 1 tablet (25 mg total) by mouth daily.    Objective: BP (!) 168/120   Pulse 83   Ht 5' 4.5" (1.638 m)   Wt 256 lb 6.4 oz (116.3 kg)   BMI 43.33 kg/m   Physical Exam:  General: Alert and oriented. and No acute distress. Gait: Normal gait.  Evaluation of the right elbow demonstrates no swelling.  No deformity is appreciated.  He is point tender directly over the lateral epicondyle.  He has pain with resisted wrist extension, as well as extension of the long finger.  Fingers are warm and well-perfused.  2+ radial pulse.  IMAGING: No new imaging obtained today   New Medications:  No orders of the defined types were placed in this encounter.     Oliver Barre, MD  11/25/2020 10:45 PM

## 2020-11-25 NOTE — Patient Instructions (Signed)
Consider Voltaren gel for pain  Use the brace when active.  It should feel tight when it is on your arm   If your arm gets worse, we can consider an injection.  Please call the office to schedule a follow up if it worsens.    Try the exercises below  [facility] Elbow Rehab Do exercises exactly as told by your health care provider and adjust them as directed. It is normal to feel mild stretching, pulling, tightness, or discomfort as you do these exercises. Stop right away if you feel sudden pain or your pain gets worse.   Stretching and range-of-motion exercises These exercises warm up your muscles and joints and improve the movement andflexibility of your elbow. Wrist flexion, assisted  Straighten your left / right elbow in front of you with your palm facing down toward the floor. If told by your health care provider, bend your left / right elbow to a 90-degree angle (right angle) at your side instead of holding it straight. With your other hand, gently push over the back of your left / right hand so your fingers point toward the floor (flexion). Stop when you feel a gentle stretch on the back of your forearm. Hold this position for 10 seconds. Repeat 10 times. Complete this exercise 1-2 times a day. Wrist extension, assisted  Straighten your left / right elbow in front of you with your palm facing up toward the ceiling. If told by your health care provider, bend your left / right elbow to a 90-degree angle (right angle) at your side instead of holding it straight. With your other hand, gently pull your left / right hand and fingers toward the floor (extension). Stop when you feel a gentle stretch on the palm side of your forearm. Hold this position for 10 seconds. Repeat 10 times. Complete this exercise 1-2 times a day. Assisted forearm rotation, supination Sit or stand with your elbows at your side. Bend your left / right elbow to a 90-degree angle (right angle). Using your uninjured  hand, turn your left / right palm up toward the ceiling (supination) until you feel a gentle stretch along the inside of your forearm. Hold this position for 10 seconds. Repeat 10 times. Complete this exercise 1-2 times a day. Assisted forearm rotation, pronation Sit or stand with your elbows at your side. Bend your left / right elbow to a 90-degree angle (right angle). Using your uninjured hand, turn your left / right palm down toward the floor (pronation) until you feel a gentle stretch along the outside of your forearm. Hold this position for 10 seconds. Repeat 10 times. Complete this exercise 1-2 times a day. Strengthening exercises These exercises build strength and endurance in your forearm and elbow. Endurance is the ability to use your muscles for a long time, even after theyget tired. Radial deviation  Stand with a 5 lbs weight or a hammer in your left / right hand. Or, sit while holding a rubber exercise band or tubing, with your left / right forearm supported on a table or countertop. Position your forearm so that the thumb is facing the ceiling, as if you are going to clap your hands. This is the neutral position. Raise your hand upward in front of you so your thumb moves toward the ceiling (radial deviation), or pull up on the rubber tubing. Keep your forearm and elbow still while you move your wrist only. Hold this position for 10 seconds. Slowly return to the starting position.  Repeat 10 times. Complete this exercise 1-2 times a day. Wrist extension, eccentric Sit with your left / right forearm palm-down and supported on a table or other surface. Let your left / right wrist extend over the edge of the surface. Hold a 5 lbs (can of soup) weight or a piece of exercise band or tubing in your left / right hand. If using a rubber exercise band or tubing, hold the other end of the tubing with your other hand. Use your uninjured hand to move your left / right hand up toward the  ceiling. Take your uninjured hand away and slowly return to the starting position using only your left / right hand. Lowering your arm under tension is called eccentric extension. Repeat 10 times. Complete this exercise 1-2 times a day. Wrist extension  Do not do this exercise if it causes pain at the outside of your elbow. Only do this exercise once instructed by your health care provider. Sit with your left / right forearm supported on a table or other surface and your palm turned down toward the floor. Let your left / right wrist extend over the edge of the surface. Hold a 5 lbs weight or a piece of rubber exercise band or tubing. If you are using a rubber exercise band or tubing, hold the band or tubing in place with your other hand to provide resistance. Slowly bend your wrist so your hand moves up toward the ceiling (extension). Move only your wrist, keeping your forearm and elbow still. Hold this position for 10 seconds. Slowly return to the starting position. Repeat 10 times. Complete this exercise 1-2 times a day. Forearm rotation, supination To do this exercise, you will need a lightweight hammer or rubber mallet. Sit with your left / right forearm supported on a table or other surface. Bend your elbow to a 90-degree angle (right angle). Position your forearm so that your palm is facing down toward the floor, with your hand resting over the edge of the table. Hold a hammer in your left / right hand. To make this exercise easier, hold the hammer near the head of the hammer. To make this exercise harder, hold the hammer near the end of the handle. Without moving your wrist or elbow, slowly rotate your forearm so your palm faces up toward the ceiling (supination). Hold this position for 10 seconds. Slowly return to the starting position. Repeat 10 times. Complete this exercise 1-2 times a day. Shoulder blade squeeze Sit in a stable chair or stand with good posture. If you are sitting  down, do not let your back touch the back of the chair. Your arms should be at your sides with your elbows bent to a 90-degree angle (right angle). Position your forearms so that your thumbs are facing the ceiling (neutral position). Without lifting your shoulders up, squeeze your shoulder blades tightly together. Hold this position for 10 seconds. Slowly release and return to the starting position. Repeat 10 times. Complete this exercise 1-2 times a day. This information is not intended to replace advice given to you by your health care provider. Make sure you discuss any questions you have with your healthcare provider. Document Revised: 03/22/2019 Document Reviewed: 03/22/2019 Elsevier Patient Education  2022 [employer].

## 2020-11-28 ENCOUNTER — Other Ambulatory Visit: Payer: Self-pay

## 2020-11-28 ENCOUNTER — Encounter: Payer: Self-pay | Admitting: Family Medicine

## 2020-11-28 ENCOUNTER — Ambulatory Visit (INDEPENDENT_AMBULATORY_CARE_PROVIDER_SITE_OTHER): Payer: Self-pay | Admitting: Family Medicine

## 2020-11-28 VITALS — BP 150/83 | HR 89 | Temp 97.7°F | Ht 64.0 in | Wt 256.0 lb

## 2020-11-28 DIAGNOSIS — I10 Essential (primary) hypertension: Secondary | ICD-10-CM

## 2020-11-28 MED ORDER — LOSARTAN POTASSIUM 50 MG PO TABS: 50.0000 mg | ORAL_TABLET | Freq: Every day | ORAL | 2 refills | Status: DC

## 2020-11-28 NOTE — Progress Notes (Signed)
Subjective:  Patient ID: Steven Houston, male    DOB: 08-18-1980, 40 y.o.   MRN: [id number]  Patient Care Team: Baruch Gouty, FNP as PCP - General (Family Medicine)   Chief Complaint:  Follow-up (B/P check)   HPI: Steven Houston is a 40 y.o. male presenting on 11/28/2020 for Follow-up (B/P check)   Patient following up today for reevaluation of hypertension.  At prior visit he had been off of losartan for some time and blood pressure was not controlled.  Losartan was started at 25 mg daily.  Patient has been tolerating well without adverse side effects.  States blood pressure still elevated at home.  Hypertension This is a chronic problem. The current episode started more than 1 year ago. The problem has been gradually improving since onset. The problem is uncontrolled. Pertinent negatives include no anxiety, blurred vision, chest pain, headaches, malaise/fatigue, neck pain, orthopnea, palpitations, peripheral edema, PND, shortness of breath or sweats. There are no associated agents to hypertension. Risk factors for coronary artery disease include dyslipidemia, family history, obesity and male gender. Past treatments include angiotensin blockers. The current treatment provides mild improvement. Compliance problems include exercise and diet.    Relevant past medical, surgical, family, and social history reviewed and updated as indicated.  Allergies and medications reviewed and updated. Data reviewed: Chart in Epic.   Past Medical History:  Diagnosis Date   Bipolar disorder ([place])    Hyperlipidemia    Hypertension     History reviewed. No pertinent surgical history.  Social History   Socioeconomic History   Marital status: Single    Spouse name: Not on file   Number of children: Not on file   Years of education: Not on file   Highest education level: Not on file  Occupational History   Not on file  Tobacco Use   Smoking status: Never   Smokeless tobacco: Never  Substance  and Sexual Activity   Alcohol use: Not Currently   Drug use: Not Currently   Sexual activity: Not on file  Other Topics Concern   Not on file  Social History Narrative   Not on file   Social Determinants of Health   Financial Resource Strain: Not on file  Food Insecurity: Not on file  Transportation Needs: Not on file  Physical Activity: Not on file  Stress: Not on file  Social Connections: Not on file  Intimate Partner Violence: Not on file    Outpatient Encounter Medications as of 11/28/2020  Medication Sig   losartan (COZAAR) 50 MG tablet Take 1 tablet (50 mg total) by mouth daily.   [DISCONTINUED] losartan (COZAAR) 25 MG tablet Take 1 tablet (25 mg total) by mouth daily.   No facility-administered encounter medications on file as of 11/28/2020.    Allergies  Allergen Reactions   Haldol [Haloperidol Lactate] Anaphylaxis and Shortness Of Breath   Amlodipine Other (See Comments)    Nausea    Review of Systems  Constitutional:  Negative for activity change, appetite change, chills, diaphoresis, fatigue, fever, malaise/fatigue and unexpected weight change.  HENT: Negative.    Eyes: Negative.  Negative for blurred vision, photophobia and visual disturbance.  Respiratory:  Negative for cough, chest tightness and shortness of breath.   Cardiovascular:  Negative for chest pain, palpitations, orthopnea, leg swelling and PND.  Gastrointestinal:  Negative for blood in stool, constipation, diarrhea, nausea and vomiting.  Endocrine: Negative.   Genitourinary:  Negative for dysuria, frequency and urgency.  Musculoskeletal:  Positive for arthralgias (elbows). Negative for myalgias and neck pain.  Skin: Negative.   Allergic/Immunologic: Negative.   Neurological:  Negative for dizziness, tremors, seizures, syncope, facial asymmetry, speech difficulty, weakness, light-headedness, numbness and headaches.  Hematological: Negative.   Psychiatric/Behavioral:  Negative for confusion,  hallucinations, sleep disturbance and suicidal ideas.   All other systems reviewed and are negative.      Objective:  BP (!) 150/83   Pulse 89   Temp 97.7 F (36.5 C)   Ht 5' 4"  (1.626 m)   Wt 256 lb (116.1 kg)   SpO2 97%   BMI 43.94 kg/m    Wt Readings from Last 3 Encounters:  11/28/20 256 lb (116.1 kg)  11/25/20 256 lb 6.4 oz (116.3 kg)  11/01/20 259 lb (117.5 kg)    Physical Exam Vitals and nursing note reviewed.  Constitutional:      General: He is not in acute distress.    Appearance: Normal appearance. He is well-developed and well-groomed. He is obese. He is not ill-appearing, toxic-appearing or diaphoretic.  HENT:     Head: Normocephalic and atraumatic.     Jaw: There is normal jaw occlusion.     Right Ear: Hearing normal.     Left Ear: Hearing normal.     Nose: Nose normal.     Mouth/Throat:     Lips: Pink.     Mouth: Mucous membranes are moist.     Pharynx: Oropharynx is clear. Uvula midline.  Eyes:     General: Lids are normal.     Extraocular Movements: Extraocular movements intact.     Conjunctiva/sclera: Conjunctivae normal.     Pupils: Pupils are equal, round, and reactive to light.  Neck:     Thyroid: No thyroid mass, thyromegaly or thyroid tenderness.     Vascular: No carotid bruit or JVD.     Trachea: Trachea and phonation normal.  Cardiovascular:     Rate and Rhythm: Normal rate and regular rhythm.     Chest Wall: PMI is not displaced.     Pulses: Normal pulses.     Heart sounds: Normal heart sounds. No murmur heard.   No friction rub. No gallop.  Pulmonary:     Effort: Pulmonary effort is normal. No respiratory distress.     Breath sounds: Normal breath sounds. No wheezing.  Abdominal:     General: There is no abdominal bruit.     Palpations: There is no hepatomegaly or splenomegaly.     Tenderness: There is no right CVA tenderness.  Musculoskeletal:        General: Normal range of motion.     Cervical back: Normal range of motion and  neck supple.     Right lower leg: No edema.     Left lower leg: No edema.  Lymphadenopathy:     Cervical: No cervical adenopathy.  Skin:    General: Skin is warm and dry.     Capillary Refill: Capillary refill takes less than 2 seconds.     Coloration: Skin is not cyanotic, jaundiced or pale.     Findings: No rash.  Neurological:     General: No focal deficit present.     Mental Status: He is alert and oriented to person, place, and time.     Sensory: Sensation is intact.     Motor: Motor function is intact.     Coordination: Coordination is intact.     Gait: Gait is intact.     Deep Tendon Reflexes:  Reflexes are normal and symmetric.  Psychiatric:        Attention and Perception: Attention and perception normal.        Mood and Affect: Mood and affect normal.        Speech: Speech normal.        Behavior: Behavior normal. Behavior is cooperative.        Thought Content: Thought content normal.        Cognition and Memory: Cognition and memory normal.        Judgment: Judgment normal.    Results for orders placed or performed in visit on 11/01/20  CBC with Differential/Platelet  Result Value Ref Range   WBC 7.6 3.4 - 10.8 x10E3/uL   RBC 5.24 4.14 - 5.80 x10E6/uL   Hemoglobin 14.2 13.0 - 17.7 g/dL   Hematocrit 43.8 37.5 - 51.0 %   MCV 84 79 - 97 fL   MCH 27.1 26.6 - 33.0 pg   MCHC 32.4 31.5 - 35.7 g/dL   RDW 13.6 11.6 - 15.4 %   Platelets 272 150 - 450 x10E3/uL   Neutrophils 60 Not Estab. %   Lymphs 29 Not Estab. %   Monocytes 9 Not Estab. %   Eos 1 Not Estab. %   Basos 0 Not Estab. %   Neutrophils Absolute 4.5 1.4 - 7.0 x10E3/uL   Lymphocytes Absolute 2.2 0.7 - 3.1 x10E3/uL   Monocytes Absolute 0.7 0.1 - 0.9 x10E3/uL   EOS (ABSOLUTE) 0.1 0.0 - 0.4 x10E3/uL   Basophils Absolute 0.0 0.0 - 0.2 x10E3/uL   Immature Granulocytes 1 Not Estab. %   Immature Grans (Abs) 0.0 0.0 - 0.1 x10E3/uL  CMP14+EGFR  Result Value Ref Range   Glucose 97 70 - 99 mg/dL   BUN 18 6 - 24  mg/dL   Creatinine, Ser 0.74 (L) 0.76 - 1.27 mg/dL   eGFR 116 >59 mL/min/1.73   BUN/Creatinine Ratio 24 (H) 9 - 20   Sodium 140 134 - 144 mmol/L   Potassium 4.5 3.5 - 5.2 mmol/L   Chloride 102 96 - 106 mmol/L   CO2 24 20 - 29 mmol/L   Calcium 9.6 8.7 - 10.2 mg/dL   Total Protein 7.5 6.0 - 8.5 g/dL   Albumin 4.8 4.0 - 5.0 g/dL   Globulin, Total 2.7 1.5 - 4.5 g/dL   Albumin/Globulin Ratio 1.8 1.2 - 2.2   Bilirubin Total 0.2 0.0 - 1.2 mg/dL   Alkaline Phosphatase 74 44 - 121 IU/L   AST 23 0 - 40 IU/L   ALT 31 0 - 44 IU/L  Lipid panel  Result Value Ref Range   Cholesterol, Total 179 100 - 199 mg/dL   Triglycerides 157 (H) 0 - 149 mg/dL   HDL 34 (L) >39 mg/dL   VLDL Cholesterol Cal 28 5 - 40 mg/dL   LDL Chol Calc (NIH) 117 (H) 0 - 99 mg/dL   Chol/HDL Ratio 5.3 (H) 0.0 - 5.0 ratio  Thyroid Panel With TSH  Result Value Ref Range   TSH 1.590 0.450 - 4.500 uIU/mL   T4, Total 6.0 4.5 - 12.0 ug/dL   T3 Uptake Ratio 33 24 - 39 %   Free Thyroxine Index 2.0 1.2 - 4.9       Pertinent labs & imaging results that were available during my care of the patient were reviewed by me and considered in my medical decision making.  Assessment & Plan:  Ziyad was seen today for follow-up.  Diagnoses and all  orders for this visit:  Essential hypertension Still not at goal. Will increase losartan to 50 mg. DASH diet and exercise discussed in detail. Will recheck BMP after initiation of ARB. Follow up in 3 months for reevaluation.  -     BMP8+EGFR -     losartan (COZAAR) 50 MG tablet; Take 1 tablet (50 mg total) by mouth daily.     Continue all other maintenance medications.  Follow up plan: Return in about 3 months (around 02/28/2021), or if symptoms worsen or fail to improve, for HTN.   Continue healthy lifestyle choices, including diet (rich in fruits, vegetables, and lean proteins, and low in salt and simple carbohydrates) and exercise (at least 30 minutes of moderate physical activity  daily).  Educational handout given for DASH diet  The above assessment and management plan was discussed with the patient. The patient verbalized understanding of and has agreed to the management plan. Patient is aware to call the clinic if they develop any new symptoms or if symptoms persist or worsen. Patient is aware when to return to the clinic for a follow-up visit. Patient educated on when it is appropriate to go to the emergency department.   Monia Pouch, FNP-C [facility] [phone]

## 2020-11-28 NOTE — Patient Instructions (Signed)

## 2020-11-29 LAB — BMP8+EGFR
BUN/Creatinine Ratio: 20 (ref 9–20)
BUN: 17 mg/dL (ref 6–24)
CO2: 24 mmol/L (ref 20–29)
Calcium: 9.7 mg/dL (ref 8.7–10.2)
Chloride: 105 mmol/L (ref 96–106)
Creatinine, Ser: 0.87 mg/dL (ref 0.76–1.27)
Glucose: 123 mg/dL — ABNORMAL HIGH (ref 70–99)
Potassium: 4.5 mmol/L (ref 3.5–5.2)
Sodium: 143 mmol/L (ref 134–144)
eGFR: 110 mL/min/{1.73_m2} (ref >59)

## 2021-03-04 ENCOUNTER — Ambulatory Visit (INDEPENDENT_AMBULATORY_CARE_PROVIDER_SITE_OTHER): Payer: Self-pay | Admitting: Family Medicine

## 2021-03-04 ENCOUNTER — Encounter: Payer: Self-pay | Admitting: Family Medicine

## 2021-03-04 DIAGNOSIS — I10 Essential (primary) hypertension: Secondary | ICD-10-CM

## 2021-03-04 MED ORDER — LOSARTAN POTASSIUM 50 MG PO TABS: 50.0000 mg | ORAL_TABLET | Freq: Every day | ORAL | 2 refills | Status: DC

## 2021-03-04 NOTE — Patient Instructions (Signed)

## 2021-03-04 NOTE — Progress Notes (Signed)
Subjective:  Patient ID: Steven Houston, male    DOB: 1980-04-03, 41 y.o.   MRN: [id number]  Patient Care Team: Baruch Gouty, FNP as PCP - General (Family Medicine)   Chief Complaint:  Hypertension   HPI: Steven Houston is a 41 y.o. male presenting on 03/04/2021 for Hypertension   Pt presents today for management of chronic medical conditions. States he has been doing very well since last visit. He has increased his losartan as prescribed with better control of blood pressure. States he has been watching his diet and has cut out sugar. States he feels much better since doing this.   Hypertension This is a chronic problem. The current episode started more than 1 month ago. The problem has been gradually improving since onset. The problem is controlled. Pertinent negatives include no anxiety, blurred vision, chest pain, headaches, malaise/fatigue, neck pain, orthopnea, palpitations, peripheral edema, PND, shortness of breath or sweats. Risk factors for coronary artery disease include male gender and obesity. Past treatments include angiotensin blockers. The current treatment provides significant improvement. There are no compliance problems.       Relevant past medical, surgical, family, and social history reviewed and updated as indicated.  Allergies and medications reviewed and updated. Data reviewed: Chart in Epic.   Past Medical History:  Diagnosis Date   Bipolar disorder ([place])    Hyperlipidemia    Hypertension     No past surgical history on file.  Social History   Socioeconomic History   Marital status: Married    Spouse name: Not on file   Number of children: Not on file   Years of education: Not on file   Highest education level: Not on file  Occupational History   Not on file  Tobacco Use   Smoking status: Never   Smokeless tobacco: Never  Substance and Sexual Activity   Alcohol use: Not Currently   Drug use: Not Currently   Sexual activity: Not on file   Other Topics Concern   Not on file  Social History Narrative   Not on file   Social Determinants of Health   Financial Resource Strain: Not on file  Food Insecurity: Not on file  Transportation Needs: Not on file  Physical Activity: Not on file  Stress: Not on file  Social Connections: Not on file  Intimate Partner Violence: Not on file    Outpatient Encounter Medications as of 03/04/2021  Medication Sig   losartan (COZAAR) 50 MG tablet Take 1 tablet (50 mg total) by mouth daily.   [DISCONTINUED] losartan (COZAAR) 50 MG tablet Take 1 tablet (50 mg total) by mouth daily.   No facility-administered encounter medications on file as of 03/04/2021.    Allergies  Allergen Reactions   Haldol [Haloperidol Lactate] Anaphylaxis and Shortness Of Breath   Amlodipine Other (See Comments)    Nausea    Review of Systems  Constitutional:  Negative for activity change, appetite change, chills, diaphoresis, fatigue, fever, malaise/fatigue and unexpected weight change.  HENT: Negative.    Eyes: Negative.  Negative for blurred vision, photophobia and visual disturbance.  Respiratory:  Negative for cough, chest tightness and shortness of breath.   Cardiovascular:  Negative for chest pain, palpitations, orthopnea, leg swelling and PND.  Gastrointestinal:  Negative for abdominal pain, blood in stool, constipation, diarrhea, nausea and vomiting.  Endocrine: Negative.  Negative for polydipsia, polyphagia and polyuria.  Genitourinary:  Negative for decreased urine volume, difficulty urinating, dysuria, frequency and urgency.  Musculoskeletal:  Negative for arthralgias, myalgias and neck pain.  Skin: Negative.   Allergic/Immunologic: Negative.   Neurological:  Negative for dizziness, tremors, seizures, syncope, facial asymmetry, speech difficulty, weakness, light-headedness, numbness and headaches.  Hematological: Negative.   Psychiatric/Behavioral:  Negative for confusion, hallucinations, sleep  disturbance and suicidal ideas.   All other systems reviewed and are negative.      Objective:  BP 135/88    Pulse 76    Temp (!) 97.1 F (36.2 C) (Temporal)    Ht _0  (1.626 m)    Wt 254 lb (115.2 kg)    BMI 43.60 kg/m    Wt Readings from Last 3 Encounters:  03/04/21 254 lb (115.2 kg)  11/28/20 256 lb (116.1 kg)  11/25/20 256 lb 6.4 oz (116.3 kg)    Physical Exam Vitals and nursing note reviewed.  Constitutional:      General: He is not in acute distress.    Appearance: Normal appearance. He is well-developed and well-groomed. He is obese. He is not ill-appearing, toxic-appearing or diaphoretic.  HENT:     Head: Normocephalic and atraumatic.     Jaw: There is normal jaw occlusion.     Right Ear: Hearing normal.     Left Ear: Hearing normal.     Nose: Nose normal.     Mouth/Throat:     Lips: Pink.     Mouth: Mucous membranes are moist.     Pharynx: Uvula midline.  Eyes:     General: Lids are normal.     Extraocular Movements: Extraocular movements intact.     Conjunctiva/sclera: Conjunctivae normal.     Pupils: Pupils are equal, round, and reactive to light.  Neck:     Thyroid: No thyroid mass, thyromegaly or thyroid tenderness.     Vascular: No carotid bruit or JVD.     Trachea: Trachea and phonation normal.  Cardiovascular:     Rate and Rhythm: Normal rate and regular rhythm.     Chest Wall: PMI is not displaced.     Pulses: Normal pulses.     Heart sounds: Normal heart sounds. No murmur heard.   No friction rub. No gallop.  Pulmonary:     Effort: Pulmonary effort is normal. No respiratory distress.     Breath sounds: Normal breath sounds. No wheezing.  Abdominal:     General: Bowel sounds are normal. There is no distension or abdominal bruit.     Palpations: Abdomen is soft. There is no hepatomegaly or splenomegaly.     Tenderness: There is no abdominal tenderness. There is no right CVA tenderness or left CVA tenderness.     Hernia: No hernia is present.   Musculoskeletal:        General: Normal range of motion.     Cervical back: Normal range of motion and neck supple.     Right lower leg: No edema.     Left lower leg: No edema.  Lymphadenopathy:     Cervical: No cervical adenopathy.  Skin:    General: Skin is warm and dry.     Capillary Refill: Capillary refill takes less than 2 seconds.     Coloration: Skin is not cyanotic, jaundiced or pale.     Findings: No rash.  Neurological:     General: No focal deficit present.     Mental Status: He is alert and oriented to person, place, and time.     Sensory: Sensation is intact.     Motor: Motor function is intact.  Coordination: Coordination is intact.     Gait: Gait is intact.     Deep Tendon Reflexes: Reflexes are normal and symmetric.  Psychiatric:        Attention and Perception: Attention and perception normal.        Mood and Affect: Mood and affect normal.        Speech: Speech normal.        Behavior: Behavior normal. Behavior is cooperative.        Thought Content: Thought content normal.        Cognition and Memory: Cognition and memory normal.        Judgment: Judgment normal.    Results for orders placed or performed in visit on 11/28/20  BMP8+EGFR  Result Value Ref Range   Glucose 123 (H) 70 - 99 mg/dL   BUN 17 6 - 24 mg/dL   Creatinine, Ser 0.87 0.76 - 1.27 mg/dL   eGFR 110 >59 mL/min/1.73   BUN/Creatinine Ratio 20 9 - 20   Sodium 143 134 - 144 mmol/L   Potassium 4.5 3.5 - 5.2 mmol/L   Chloride 105 96 - 106 mmol/L   CO2 24 20 - 29 mmol/L   Calcium 9.7 8.7 - 10.2 mg/dL       Pertinent labs & imaging results that were available during my care of the patient were reviewed by me and considered in my medical decision making.  Assessment & Plan:  Danyell was seen today for hypertension.  Diagnoses and all orders for this visit:  Morbid obesity ([place]) Has cut out sugar in diet and is exercising on a regular basis. Encouraged to continue efforts.    Essential hypertension BP well controlled. Changes were not made in regimen today. Goal BP is 130/80. Pt aware to report any persistent high or low readings. DASH diet and exercise encouraged. Exercise at least 150 minutes per week and increase as tolerated. Goal BMI > 25. Stress management encouraged. Avoid nicotine and tobacco product use. Avoid excessive alcohol and NSAID's. Avoid more than 2000 mg of sodium daily. Medications as prescribed. Follow up as scheduled.  -     losartan (COZAAR) 50 MG tablet; Take 1 tablet (50 mg total) by mouth daily.     Continue all other maintenance medications.  Follow up plan: Return in about 6 months (around 09/01/2021), or if symptoms worsen or fail to improve, for HTN, labs.   Continue healthy lifestyle choices, including diet (rich in fruits, vegetables, and lean proteins, and low in salt and simple carbohydrates) and exercise (at least 30 minutes of moderate physical activity daily).  Educational handout given for DASH diet, HTN  The above assessment and management plan was discussed with the patient. The patient verbalized understanding of and has agreed to the management plan. Patient is aware to call the clinic if they develop any new symptoms or if symptoms persist or worsen. Patient is aware when to return to the clinic for a follow-up visit. Patient educated on when it is appropriate to go to the emergency department.   Monia Pouch, FNP-C [facility] [phone]

## 2021-09-02 ENCOUNTER — Ambulatory Visit (INDEPENDENT_AMBULATORY_CARE_PROVIDER_SITE_OTHER): Payer: PRIVATE HEALTH INSURANCE | Admitting: Family Medicine

## 2021-09-02 ENCOUNTER — Encounter: Payer: Self-pay | Admitting: Family Medicine

## 2021-09-02 VITALS — BP 148/84 | HR 75 | Temp 98.8°F | Ht 64.0 in | Wt 256.0 lb

## 2021-09-02 DIAGNOSIS — I10 Essential (primary) hypertension: Secondary | ICD-10-CM | POA: Diagnosis not present

## 2021-09-02 DIAGNOSIS — E782 Mixed hyperlipidemia: Secondary | ICD-10-CM | POA: Diagnosis not present

## 2021-09-02 MED ORDER — LOSARTAN POTASSIUM 50 MG PO TABS: 50.0000 mg | ORAL_TABLET | Freq: Two times a day (BID) | ORAL | 3 refills | Status: DC

## 2021-09-02 NOTE — Patient Instructions (Signed)
Diet encouraged - increase intake of fresh fruits and vegetables, increase intake of lean proteins. Bake, broil, or grill foods. Avoid fried, greasy, and fatty foods. Avoid fast foods. Increase intake of fiber-rich whole grains. Exercise encouraged - at least 150 minutes per week and advance as tolerated. Goal BMI < 25. Follow up in 3-6 months as discussed.    Goal BP:  For patients younger than 60: Goal BP < 140/90. For patients 60 and older: Goal BP < 150/90. For patients with diabetes: Goal BP < 140/90.  Take your medications faithfully as prescribed. Maintain a healthy weight. Get at least 150 minutes of aerobic exercise per week. Minimize salt intake, less than 2000 mg per day. Minimize alcohol intake.  DASH Eating Plan DASH stands for "Dietary Approaches to Stop Hypertension." The DASH eating plan is a healthy eating plan that has been shown to reduce high blood pressure (hypertension). Additional health benefits may include reducing the risk of type 2 diabetes mellitus, heart disease, and stroke. The DASH eating plan may also help with weight loss.  WHAT DO I NEED TO KNOW ABOUT THE DASH EATING PLAN? For the DASH eating plan, you will follow these general guidelines: Choose foods with a percent daily value for sodium of less than 5% (as listed on the food label). Use salt-free seasonings or herbs instead of table salt or sea salt. Check with your health care provider or pharmacist before using salt substitutes. Eat lower-sodium products, often labeled as "lower sodium" or "no salt added." Eat fresh foods. Eat more vegetables, fruits, and low-fat dairy products. Choose whole grains. Look for the word "whole" as the first word in the ingredient list. Choose fish and skinless chicken or [place] more often than red meat. Limit fish, poultry, and meat to 6 oz (170 g) each day. Limit sweets, desserts, sugars, and sugary drinks. Choose heart-healthy fats. Limit cheese to 1 oz (28 g) per  day. Eat more home-cooked food and less restaurant, buffet, and fast food. Limit fried foods. Cook foods using methods other than frying. Limit canned vegetables. If you do use them, rinse them well to decrease the sodium. When eating at a restaurant, ask that your food be prepared with less salt, or no salt if possible.  WHAT FOODS CAN I EAT? Seek help from a dietitian for individual calorie needs.  Grains Whole grain or whole wheat bread. Brown rice. Whole grain or whole wheat pasta. Quinoa, bulgur, and whole grain cereals. Low-sodium cereals. Corn or whole wheat flour tortillas. Whole grain cornbread. Whole grain crackers. Low-sodium crackers.  Vegetables Fresh or frozen vegetables (raw, steamed, roasted, or grilled). Low-sodium or reduced-sodium tomato and vegetable juices. Low-sodium or reduced-sodium tomato sauce and paste. Low-sodium or reduced-sodium canned vegetables.   Fruits All fresh, canned (in natural juice), or frozen fruits.  Meat and Other Protein Products Ground beef (85% or leaner), grass-fed beef, or beef trimmed of fat. Skinless chicken or [place]. Ground chicken or [place]. Pork trimmed of fat. All fish and seafood. Eggs. Dried beans, peas, or lentils. Unsalted nuts and seeds. Unsalted canned beans.  Dairy Low-fat dairy products, such as skim or 1% milk, 2% or reduced-fat cheeses, low-fat ricotta or cottage cheese, or plain low-fat yogurt. Low-sodium or reduced-sodium cheeses.  Fats and Oils Tub margarines without trans fats. Light or reduced-fat mayonnaise and salad dressings (reduced sodium). Avocado. Safflower, olive, or canola oils. Natural peanut or almond butter.  Other Unsalted popcorn and pretzels. The items listed above may not be a complete  list of recommended foods or beverages. Contact your dietitian for more options.  WHAT FOODS ARE NOT RECOMMENDED?  Grains White bread. White pasta. White rice. Refined cornbread. Bagels and croissants. Crackers that  contain trans fat.  Vegetables Creamed or fried vegetables. Vegetables in a cheese sauce. Regular canned vegetables. Regular canned tomato sauce and paste. Regular tomato and vegetable juices.  Fruits Dried fruits. Canned fruit in light or heavy syrup. Fruit juice.  Meat and Other Protein Products Fatty cuts of meat. Ribs, chicken wings, bacon, sausage, bologna, salami, chitterlings, fatback, hot dogs, bratwurst, and packaged luncheon meats. Salted nuts and seeds. Canned beans with salt.  Dairy Whole or 2% milk, cream, half-and-half, and cream cheese. Whole-fat or sweetened yogurt. Full-fat cheeses or blue cheese. Nondairy creamers and whipped toppings. Processed cheese, cheese spreads, or cheese curds.  Condiments Onion and garlic salt, seasoned salt, table salt, and sea salt. Canned and packaged gravies. Worcestershire sauce. Tartar sauce. Barbecue sauce. Teriyaki sauce. Soy sauce, including reduced sodium. Steak sauce. Fish sauce. Oyster sauce. Cocktail sauce. Horseradish. Ketchup and mustard. Meat flavorings and tenderizers. Bouillon cubes. Hot sauce. Tabasco sauce. Marinades. Taco seasonings. Relishes.  Fats and Oils Butter, stick margarine, lard, shortening, ghee, and bacon fat. Coconut, palm kernel, or palm oils. Regular salad dressings.  Other Pickles and olives. Salted popcorn and pretzels.  The items listed above may not be a complete list of foods and beverages to avoid. Contact your dietitian for more information.  WHERE CAN I FIND MORE INFORMATION? National [employer], [employer], [employer]: [employer] Document Released: 12/18/2010 Document Revised: 05/15/2013 Document Reviewed: 11/02/2012 [facility] Patient Information 2015 [place], [place]. This information is not intended to replace advice given to you by your health care provider. Make sure you discuss any questions you have with your health care provider.   I think that you would  greatly benefit from seeing a nutritionist.  If you are interested, please call Dr. Gerilyn Pilgrim at [phone] to schedule an appointment.

## 2021-09-02 NOTE — Progress Notes (Signed)
Subjective:  Patient ID: Steven Houston, male    DOB: 1980-02-24, 41 y.o.   MRN: [id number]  Patient Care Team: Baruch Gouty, FNP as PCP - General (Family Medicine)   Chief Complaint:  Medical Management of Chronic Issues   HPI: Steven Houston is a 41 y.o. male presenting on 09/02/2021 for Medical Management of Chronic Issues   1. Essential hypertension Had been taking losartan without associated side effects. Denies chest pain, headaches, visual changes, leg swelling, confusion, weakness, or dizziness. He does not follow a strict diet or exercise routine. He has recently started back to work and feels this has been beneficial.   2. Morbid obesity ([place]) Does not follow a strict diet or an exercise routine. Denies fatigue or exercise intolerance when he is active.   3. Mixed hyperlipidemia Does not follow a strict diet or an exercise routine. He is fasting today, will check labs.      Relevant past medical, surgical, family, and social history reviewed and updated as indicated.  Allergies and medications reviewed and updated. Data reviewed: Chart in Epic.   Past Medical History:  Diagnosis Date   Bipolar disorder ([place])    Hyperlipidemia    Hypertension     History reviewed. No pertinent surgical history.  Social History   Socioeconomic History   Marital status: Married    Spouse name: Not on file   Number of children: Not on file   Years of education: Not on file   Highest education level: Not on file  Occupational History   Not on file  Tobacco Use   Smoking status: Never   Smokeless tobacco: Never  Substance and Sexual Activity   Alcohol use: Not Currently   Drug use: Not Currently   Sexual activity: Not on file  Other Topics Concern   Not on file  Social History Narrative   Not on file   Social Determinants of Health   Financial Resource Strain: Not on file  Food Insecurity: Not on file  Transportation Needs: Not on file  Physical Activity: Not on  file  Stress: Not on file  Social Connections: Not on file  Intimate Partner Violence: Not on file    Outpatient Encounter Medications as of 09/02/2021  Medication Sig   [DISCONTINUED] losartan (COZAAR) 50 MG tablet Take 1 tablet (50 mg total) by mouth daily.   losartan (COZAAR) 50 MG tablet Take 1 tablet (50 mg total) by mouth in the morning and at bedtime.   No facility-administered encounter medications on file as of 09/02/2021.    Allergies  Allergen Reactions   Haldol [Haloperidol Lactate] Anaphylaxis and Shortness Of Breath   Amlodipine Other (See Comments)    Nausea    Review of Systems  Constitutional:  Negative for activity change, appetite change, chills, diaphoresis, fatigue, fever and unexpected weight change.  HENT: Negative.    Eyes: Negative.  Negative for photophobia and visual disturbance.  Respiratory:  Negative for cough, chest tightness and shortness of breath.   Cardiovascular:  Negative for chest pain, palpitations and leg swelling.  Gastrointestinal:  Negative for abdominal pain, blood in stool, constipation, diarrhea, nausea and vomiting.  Endocrine: Negative.  Negative for cold intolerance, heat intolerance, polydipsia, polyphagia and polyuria.  Genitourinary:  Negative for decreased urine volume, difficulty urinating, dysuria, frequency and urgency.  Musculoskeletal:  Negative for arthralgias and myalgias.  Skin: Negative.   Allergic/Immunologic: Negative.   Neurological:  Negative for dizziness, tremors, seizures, syncope, facial asymmetry, speech difficulty,  weakness, light-headedness, numbness and headaches.  Hematological: Negative.   Psychiatric/Behavioral:  Negative for confusion, hallucinations, sleep disturbance and suicidal ideas.   All other systems reviewed and are negative.       Objective:  BP (!) 148/84   Pulse 75   Temp 98.8 F (37.1 C)   Ht _0  (1.626 m)   Wt 256 lb (116.1 kg)   SpO2 95%   BMI 43.94 kg/m    Wt Readings from  Last 3 Encounters:  09/02/21 256 lb (116.1 kg)  03/04/21 254 lb (115.2 kg)  11/28/20 256 lb (116.1 kg)    Physical Exam Vitals and nursing note reviewed.  Constitutional:      General: He is not in acute distress.    Appearance: Normal appearance. He is well-developed and well-groomed. He is morbidly obese. He is not ill-appearing, toxic-appearing or diaphoretic.  HENT:     Head: Normocephalic and atraumatic.     Jaw: There is normal jaw occlusion.     Right Ear: Hearing normal.     Left Ear: Hearing normal.     Nose: Nose normal.     Mouth/Throat:     Lips: Pink.     Mouth: Mucous membranes are moist.     Pharynx: Oropharynx is clear. Uvula midline.  Eyes:     General: Lids are normal.     Extraocular Movements: Extraocular movements intact.     Conjunctiva/sclera: Conjunctivae normal.     Pupils: Pupils are equal, round, and reactive to light.  Neck:     Thyroid: No thyroid mass, thyromegaly or thyroid tenderness.     Vascular: No carotid bruit or JVD.     Trachea: Trachea and phonation normal.  Cardiovascular:     Rate and Rhythm: Normal rate and regular rhythm.     Chest Wall: PMI is not displaced.     Pulses: Normal pulses.     Heart sounds: Normal heart sounds. No murmur heard.    No friction rub. No gallop.  Pulmonary:     Effort: Pulmonary effort is normal. No respiratory distress.     Breath sounds: Normal breath sounds. No wheezing.  Abdominal:     General: Bowel sounds are normal. There is no distension or abdominal bruit.     Palpations: Abdomen is soft. There is no hepatomegaly or splenomegaly.     Tenderness: There is no abdominal tenderness. There is no right CVA tenderness or left CVA tenderness.     Hernia: No hernia is present.  Musculoskeletal:        General: Normal range of motion.     Cervical back: Normal range of motion and neck supple.     Right lower leg: No edema.     Left lower leg: No edema.  Lymphadenopathy:     Cervical: No cervical  adenopathy.  Skin:    General: Skin is warm and dry.     Capillary Refill: Capillary refill takes less than 2 seconds.     Coloration: Skin is not cyanotic, jaundiced or pale.     Findings: No rash.  Neurological:     General: No focal deficit present.     Mental Status: He is alert and oriented to person, place, and time.     Cranial Nerves: No cranial nerve deficit.     Sensory: Sensation is intact. No sensory deficit.     Motor: Motor function is intact. No weakness.     Coordination: Coordination is intact. Coordination normal.  Gait: Gait is intact. Gait normal.     Deep Tendon Reflexes: Reflexes are normal and symmetric. Reflexes normal.  Psychiatric:        Attention and Perception: Attention and perception normal.        Mood and Affect: Mood and affect normal.        Speech: Speech normal.        Behavior: Behavior normal. Behavior is cooperative.        Thought Content: Thought content normal.        Cognition and Memory: Cognition and memory normal.        Judgment: Judgment normal.     Results for orders placed or performed in visit on 11/28/20  BMP8+EGFR  Result Value Ref Range   Glucose 123 (H) 70 - 99 mg/dL   BUN 17 6 - 24 mg/dL   Creatinine, Ser 0.87 0.76 - 1.27 mg/dL   eGFR 110 >59 mL/min/1.73   BUN/Creatinine Ratio 20 9 - 20   Sodium 143 134 - 144 mmol/L   Potassium 4.5 3.5 - 5.2 mmol/L   Chloride 105 96 - 106 mmol/L   CO2 24 20 - 29 mmol/L   Calcium 9.7 8.7 - 10.2 mg/dL       Pertinent labs & imaging results that were available during my care of the patient were reviewed by me and considered in my medical decision making.  Assessment & Plan:  Steven Houston was seen today for medical management of chronic issues.  Diagnoses and all orders for this visit:  Essential hypertension BP not well controlled. Changes were made in regimen today, increase losartan to 50 mg twice daily. Goal BP is 130/80. Pt aware to report any persistent high or low readings.  DASH diet and exercise encouraged. Exercise at least 150 minutes per week and increase as tolerated. Goal BMI > 25. Stress management encouraged. Avoid nicotine and tobacco product use. Avoid excessive alcohol and NSAID's. Avoid more than 2000 mg of sodium daily. Medications as prescribed. Follow up as scheduled.  -     CMP14+EGFR -     CBC with Differential/Platelet -     Lipid panel -     Thyroid Panel With TSH -     losartan (COZAAR) 50 MG tablet; Take 1 tablet (50 mg total) by mouth in the morning and at bedtime.  Morbid obesity ([place]) Diet and exercise encouraged and information provided. Labs pending.  -     CMP14+EGFR -     CBC with Differential/Platelet -     Lipid panel -     Thyroid Panel With TSH  Mixed hyperlipidemia Diet encouraged - increase intake of fresh fruits and vegetables, increase intake of lean proteins. Bake, broil, or grill foods. Avoid fried, greasy, and fatty foods. Avoid fast foods. Increase intake of fiber-rich whole grains. Exercise encouraged - at least 150 minutes per week and advance as tolerated. Goal BMI < 25. Will discuss addition of medications if warranted.  -     CMP14+EGFR -     Lipid panel -     Thyroid Panel With TSH     Continue all other maintenance medications.  Follow up plan: Return in about 3 months (around 12/03/2021) for HTN.   Continue healthy lifestyle choices, including diet (rich in fruits, vegetables, and lean proteins, and low in salt and simple carbohydrates) and exercise (at least 30 minutes of moderate physical activity daily).  Educational handout given for DASH diet, HTN  The above assessment and  management plan was discussed with the patient. The patient verbalized understanding of and has agreed to the management plan. Patient is aware to call the clinic if they develop any new symptoms or if symptoms persist or worsen. Patient is aware when to return to the clinic for a follow-up visit. Patient educated on when it is  appropriate to go to the emergency department.   Monia Pouch, FNP-C [facility] [phone]

## 2021-09-03 LAB — CBC WITH DIFFERENTIAL/PLATELET
Basophils Absolute: 0 10*3/uL (ref 0.0–0.2)
Basos: 0 %
EOS (ABSOLUTE): 0.1 10*3/uL (ref 0.0–0.4)
Eos: 1 %
Hematocrit: 42.6 % (ref 37.5–51.0)
Hemoglobin: 14.2 g/dL (ref 13.0–17.7)
Immature Grans (Abs): 0.1 10*3/uL (ref 0.0–0.1)
Immature Granulocytes: 1 %
Lymphocytes Absolute: 2.5 10*3/uL (ref 0.7–3.1)
Lymphs: 34 %
MCH: 27.9 pg (ref 26.6–33.0)
MCHC: 33.3 g/dL (ref 31.5–35.7)
MCV: 84 fL (ref 79–97)
Monocytes Absolute: 0.6 10*3/uL (ref 0.1–0.9)
Monocytes: 9 %
Neutrophils Absolute: 4.1 10*3/uL (ref 1.4–7.0)
Neutrophils: 55 %
Platelets: 283 10*3/uL (ref 150–450)
RBC: 5.09 x10E6/uL (ref 4.14–5.80)
RDW: 13.1 % (ref 11.6–15.4)
WBC: 7.3 10*3/uL (ref 3.4–10.8)

## 2021-09-03 LAB — THYROID PANEL WITH TSH
Free Thyroxine Index: 1.5 (ref 1.2–4.9)
T3 Uptake Ratio: 28 % (ref 24–39)
T4, Total: 5.5 ug/dL (ref 4.5–12.0)
TSH: 1.15 u[IU]/mL (ref 0.450–4.500)

## 2021-09-03 LAB — CMP14+EGFR
ALT: 30 IU/L (ref 0–44)
AST: 21 IU/L (ref 0–40)
Albumin/Globulin Ratio: 1.6 (ref 1.2–2.2)
Albumin: 4.7 g/dL (ref 4.1–5.1)
Alkaline Phosphatase: 71 IU/L (ref 44–121)
BUN/Creatinine Ratio: 17 (ref 9–20)
BUN: 13 mg/dL (ref 6–24)
Bilirubin Total: 0.3 mg/dL (ref 0.0–1.2)
CO2: 23 mmol/L (ref 20–29)
Calcium: 10 mg/dL (ref 8.7–10.2)
Chloride: 103 mmol/L (ref 96–106)
Creatinine, Ser: 0.76 mg/dL (ref 0.76–1.27)
Globulin, Total: 3 g/dL (ref 1.5–4.5)
Glucose: 99 mg/dL (ref 70–99)
Potassium: 4.3 mmol/L (ref 3.5–5.2)
Sodium: 139 mmol/L (ref 134–144)
Total Protein: 7.7 g/dL (ref 6.0–8.5)
eGFR: 114 mL/min/{1.73_m2} (ref >59)

## 2021-09-03 LAB — LIPID PANEL
Chol/HDL Ratio: 5.3 ratio — ABNORMAL HIGH (ref 0.0–5.0)
Cholesterol, Total: 185 mg/dL (ref 100–199)
HDL: 35 mg/dL — ABNORMAL LOW (ref >39)
LDL Chol Calc (NIH): 118 mg/dL — ABNORMAL HIGH (ref 0–99)
Triglycerides: 182 mg/dL — ABNORMAL HIGH (ref 0–149)
VLDL Cholesterol Cal: 32 mg/dL (ref 5–40)

## 2021-10-01 ENCOUNTER — Telehealth: Payer: Self-pay | Admitting: *Deleted

## 2021-10-01 NOTE — Telephone Encounter (Signed)
I called and spoke to [employer] and Losartan is an exclusion from pt's pharmacy benefits and they couldn't give me a list of alternatives but said the pt can create a portal on [employer] to get a list of formularies and then call us back and let us know what is covered.    I did call pt and left message advising of the above and to call us back.

## 2021-12-09 ENCOUNTER — Ambulatory Visit: Payer: Medicaid Other | Admitting: Family Medicine

## 2021-12-17 ENCOUNTER — Encounter: Payer: Self-pay | Admitting: Family Medicine

## 2021-12-17 ENCOUNTER — Ambulatory Visit (INDEPENDENT_AMBULATORY_CARE_PROVIDER_SITE_OTHER): Payer: PRIVATE HEALTH INSURANCE | Admitting: Family Medicine

## 2021-12-17 DIAGNOSIS — I10 Essential (primary) hypertension: Secondary | ICD-10-CM | POA: Diagnosis not present

## 2021-12-17 MED ORDER — LOSARTAN POTASSIUM 50 MG PO TABS: 50.0000 mg | ORAL_TABLET | Freq: Two times a day (BID) | ORAL | 3 refills | Status: DC

## 2021-12-17 NOTE — Progress Notes (Signed)
Subjective:  Patient ID: Steven Houston, male    DOB: 01/02/1981, 41 y.o.   MRN: [id number]  Patient Care Team: Baruch Gouty, FNP as PCP - General (Family Medicine)   Chief Complaint:  Hypertension   HPI: Steven Houston is a 41 y.o. male presenting on 12/17/2021 for Hypertension   Hypertension This is a chronic problem. The current episode started more than 1 year ago. The problem has been waxing and waning since onset. The problem is controlled. Pertinent negatives include no anxiety, blurred vision, chest pain, headaches, malaise/fatigue, neck pain, orthopnea, palpitations, peripheral edema, PND, shortness of breath or sweats. There are no associated agents to hypertension. Risk factors for coronary artery disease include male gender, obesity and family history. Past treatments include angiotensin blockers. The current treatment provides significant improvement. Compliance problems include exercise and diet.  There is no history of angina, kidney disease, CAD/MI, CVA, heart failure, left ventricular hypertrophy, PVD or retinopathy.     Relevant past medical, surgical, family, and social history reviewed and updated as indicated.  Allergies and medications reviewed and updated. Data reviewed: Chart in Epic.   Past Medical History:  Diagnosis Date   Bipolar disorder ([place])    Hyperlipidemia    Hypertension     History reviewed. No pertinent surgical history.  Social History   Socioeconomic History   Marital status: Married    Spouse name: Not on file   Number of children: Not on file   Years of education: Not on file   Highest education level: Not on file  Occupational History   Not on file  Tobacco Use   Smoking status: Never   Smokeless tobacco: Never  Substance and Sexual Activity   Alcohol use: Not Currently   Drug use: Not Currently   Sexual activity: Not on file  Other Topics Concern   Not on file  Social History Narrative   Not on file   Social  Determinants of Health   Financial Resource Strain: Not on file  Food Insecurity: Not on file  Transportation Needs: Not on file  Physical Activity: Not on file  Stress: Not on file  Social Connections: Not on file  Intimate Partner Violence: Not on file    Outpatient Encounter Medications as of 12/17/2021  Medication Sig   [DISCONTINUED] losartan (COZAAR) 50 MG tablet Take 1 tablet (50 mg total) by mouth in the morning and at bedtime.   [DISCONTINUED] risperiDONE (RISPERDAL) 3 MG tablet Take 3 mg by mouth daily.   losartan (COZAAR) 50 MG tablet Take 1 tablet (50 mg total) by mouth in the morning and at bedtime.   No facility-administered encounter medications on file as of 12/17/2021.    Allergies  Allergen Reactions   Haldol [Haloperidol Lactate] Anaphylaxis and Shortness Of Breath   Amlodipine Other (See Comments)    Nausea    Review of Systems  Constitutional:  Negative for activity change, appetite change, chills, diaphoresis, fatigue, fever, malaise/fatigue and unexpected weight change.  HENT: Negative.    Eyes: Negative.  Negative for blurred vision, photophobia and visual disturbance.  Respiratory:  Negative for cough, chest tightness and shortness of breath.   Cardiovascular:  Negative for chest pain, palpitations, orthopnea, leg swelling and PND.  Gastrointestinal:  Negative for abdominal pain, blood in stool, constipation, diarrhea, nausea and vomiting.  Endocrine: Negative.   Genitourinary:  Negative for decreased urine volume, difficulty urinating, dysuria, frequency and urgency.  Musculoskeletal:  Negative for arthralgias, myalgias and neck pain.  Skin: Negative.   Allergic/Immunologic: Negative.   Neurological:  Negative for dizziness, tremors, seizures, syncope, facial asymmetry, speech difficulty, weakness, numbness and headaches.  Hematological: Negative.   Psychiatric/Behavioral:  Negative for confusion, hallucinations, sleep disturbance and suicidal ideas.    All other systems reviewed and are negative.       Objective:  BP 133/74   Pulse 87   Temp (!) 97.5 F (36.4 C)   Ht _0  (1.626 m)   Wt 259 lb 12.8 oz (117.8 kg)   SpO2 96%   BMI 44.59 kg/m    Wt Readings from Last 3 Encounters:  12/17/21 259 lb 12.8 oz (117.8 kg)  09/02/21 256 lb (116.1 kg)  03/04/21 254 lb (115.2 kg)    Physical Exam Vitals and nursing note reviewed.  Constitutional:      General: He is not in acute distress.    Appearance: Normal appearance. He is well-developed and well-groomed. He is morbidly obese. He is not ill-appearing, toxic-appearing or diaphoretic.  HENT:     Head: Normocephalic and atraumatic.     Jaw: There is normal jaw occlusion.     Right Ear: Hearing normal.     Left Ear: Hearing normal.     Nose: Nose normal.     Mouth/Throat:     Lips: Pink.     Mouth: Mucous membranes are moist.     Pharynx: Uvula midline.  Eyes:     General: Lids are normal.     Pupils: Pupils are equal, round, and reactive to light.  Neck:     Thyroid: No thyroid mass, thyromegaly or thyroid tenderness.     Vascular: No carotid bruit or JVD.     Trachea: Trachea and phonation normal.  Cardiovascular:     Rate and Rhythm: Normal rate and regular rhythm.     Chest Wall: PMI is not displaced.     Pulses: Normal pulses.     Heart sounds: Normal heart sounds. No murmur heard.    No friction rub. No gallop.  Pulmonary:     Effort: Pulmonary effort is normal. No respiratory distress.     Breath sounds: Normal breath sounds. No wheezing.  Abdominal:     General: There is no distension or abdominal bruit.     Palpations: There is no hepatomegaly or splenomegaly.  Musculoskeletal:        General: Normal range of motion.     Cervical back: Normal range of motion and neck supple.     Right lower leg: No edema.     Left lower leg: No edema.  Lymphadenopathy:     Cervical: No cervical adenopathy.  Skin:    General: Skin is warm and dry.     Capillary  Refill: Capillary refill takes less than 2 seconds.     Coloration: Skin is not cyanotic, jaundiced or pale.     Findings: No rash.  Neurological:     General: No focal deficit present.     Mental Status: He is alert and oriented to person, place, and time.     Sensory: Sensation is intact.     Motor: Motor function is intact.     Coordination: Coordination is intact.     Gait: Gait is intact.     Deep Tendon Reflexes: Reflexes are normal and symmetric.  Psychiatric:        Attention and Perception: Attention and perception normal.        Mood and Affect: Mood and affect normal.  Speech: Speech normal.        Behavior: Behavior normal. Behavior is cooperative.        Thought Content: Thought content normal.        Cognition and Memory: Cognition and memory normal.        Judgment: Judgment normal.     Results for orders placed or performed in visit on 09/02/21  CMP14+EGFR  Result Value Ref Range   Glucose 99 70 - 99 mg/dL   BUN 13 6 - 24 mg/dL   Creatinine, Ser 0.76 0.76 - 1.27 mg/dL   eGFR 114 >59 mL/min/1.73   BUN/Creatinine Ratio 17 9 - 20   Sodium 139 134 - 144 mmol/L   Potassium 4.3 3.5 - 5.2 mmol/L   Chloride 103 96 - 106 mmol/L   CO2 23 20 - 29 mmol/L   Calcium 10.0 8.7 - 10.2 mg/dL   Total Protein 7.7 6.0 - 8.5 g/dL   Albumin 4.7 4.1 - 5.1 g/dL   Globulin, Total 3.0 1.5 - 4.5 g/dL   Albumin/Globulin Ratio 1.6 1.2 - 2.2   Bilirubin Total 0.3 0.0 - 1.2 mg/dL   Alkaline Phosphatase 71 44 - 121 IU/L   AST 21 0 - 40 IU/L   ALT 30 0 - 44 IU/L  CBC with Differential/Platelet  Result Value Ref Range   WBC 7.3 3.4 - 10.8 x10E3/uL   RBC 5.09 4.14 - 5.80 x10E6/uL   Hemoglobin 14.2 13.0 - 17.7 g/dL   Hematocrit 42.6 37.5 - 51.0 %   MCV 84 79 - 97 fL   MCH 27.9 26.6 - 33.0 pg   MCHC 33.3 31.5 - 35.7 g/dL   RDW 13.1 11.6 - 15.4 %   Platelets 283 150 - 450 x10E3/uL   Neutrophils 55 Not Estab. %   Lymphs 34 Not Estab. %   Monocytes 9 Not Estab. %   Eos 1 Not  Estab. %   Basos 0 Not Estab. %   Neutrophils Absolute 4.1 1.4 - 7.0 x10E3/uL   Lymphocytes Absolute 2.5 0.7 - 3.1 x10E3/uL   Monocytes Absolute 0.6 0.1 - 0.9 x10E3/uL   EOS (ABSOLUTE) 0.1 0.0 - 0.4 x10E3/uL   Basophils Absolute 0.0 0.0 - 0.2 x10E3/uL   Immature Granulocytes 1 Not Estab. %   Immature Grans (Abs) 0.1 0.0 - 0.1 x10E3/uL  Lipid panel  Result Value Ref Range   Cholesterol, Total 185 100 - 199 mg/dL   Triglycerides 182 (H) 0 - 149 mg/dL   HDL 35 (L) >39 mg/dL   VLDL Cholesterol Cal 32 5 - 40 mg/dL   LDL Chol Calc (NIH) 118 (H) 0 - 99 mg/dL   Chol/HDL Ratio 5.3 (H) 0.0 - 5.0 ratio  Thyroid Panel With TSH  Result Value Ref Range   TSH 1.150 0.450 - 4.500 uIU/mL   T4, Total 5.5 4.5 - 12.0 ug/dL   T3 Uptake Ratio 28 24 - 39 %   Free Thyroxine Index 1.5 1.2 - 4.9       Pertinent labs & imaging results that were available during my care of the patient were reviewed by me and considered in my medical decision making.  Assessment & Plan:  Zaahir was seen today for hypertension.  Diagnoses and all orders for this visit:  Essential hypertension BP well controlled. Changes were not made in regimen today. Goal BP is 130/80. Pt aware to report any persistent high or low readings. DASH diet and exercise encouraged. Exercise at least 150 minutes per week  and increase as tolerated. Goal BMI > 25. Stress management encouraged. Avoid nicotine and tobacco product use. Avoid excessive alcohol and NSAID's. Avoid more than 2000 mg of sodium daily. Medications as prescribed. Follow up as scheduled.  -     losartan (COZAAR) 50 MG tablet; Take 1 tablet (50 mg total) by mouth in the morning and at bedtime.  Morbid obesity ([place]) Diet and exercise encouraged. Hand out provided.     Continue all other maintenance medications.  Follow up plan: Return in about 6 months (around 06/18/2022), or if symptoms worsen or fail to improve, for CPE.   Continue healthy lifestyle choices, including  diet (rich in fruits, vegetables, and lean proteins, and low in salt and simple carbohydrates) and exercise (at least 30 minutes of moderate physical activity daily).  Educational handout given for calorie counting for weight loss, DASH diet  The above assessment and management plan was discussed with the patient. The patient verbalized understanding of and has agreed to the management plan. Patient is aware to call the clinic if they develop any new symptoms or if symptoms persist or worsen. Patient is aware when to return to the clinic for a follow-up visit. Patient educated on when it is appropriate to go to the emergency department.   Monia Pouch, FNP-C [facility] [phone]

## 2021-12-17 NOTE — Patient Instructions (Addendum)

## 2022-06-25 ENCOUNTER — Encounter: Payer: PRIVATE HEALTH INSURANCE | Admitting: Family Medicine

## 2022-10-16 ENCOUNTER — Encounter: Payer: PRIVATE HEALTH INSURANCE | Admitting: Family Medicine

## 2022-10-19 ENCOUNTER — Encounter: Payer: Self-pay | Admitting: Family Medicine

## 2022-12-22 ENCOUNTER — Other Ambulatory Visit: Payer: Self-pay | Admitting: Family Medicine

## 2022-12-22 DIAGNOSIS — I10 Essential (primary) hypertension: Secondary | ICD-10-CM

## 2023-01-26 ENCOUNTER — Encounter: Payer: Self-pay | Admitting: Family Medicine

## 2023-01-26 ENCOUNTER — Other Ambulatory Visit: Payer: Self-pay | Admitting: Family Medicine

## 2023-01-26 DIAGNOSIS — I10 Essential (primary) hypertension: Secondary | ICD-10-CM

## 2023-01-26 NOTE — Telephone Encounter (Signed)
NA letter mailed

## 2023-01-26 NOTE — Telephone Encounter (Signed)
 Rakes pt NTBS 30-d given 12/22/22

## 2023-01-28 ENCOUNTER — Other Ambulatory Visit: Payer: Self-pay

## 2023-01-28 ENCOUNTER — Emergency Department (HOSPITAL_COMMUNITY): Payer: Worker's Compensation

## 2023-01-28 ENCOUNTER — Encounter (HOSPITAL_COMMUNITY): Payer: Self-pay

## 2023-01-28 ENCOUNTER — Emergency Department (HOSPITAL_COMMUNITY)
Admission: EM | Admit: 2023-01-28 | Discharge: 2023-01-28 | Disposition: A | Discharge status: Home / Self Care | Payer: Worker's Compensation | Attending: Emergency Medicine | Admitting: Emergency Medicine

## 2023-01-28 DIAGNOSIS — Z23 Encounter for immunization: Secondary | ICD-10-CM | POA: Insufficient documentation

## 2023-01-28 DIAGNOSIS — Z79899 Other long term (current) drug therapy: Secondary | ICD-10-CM | POA: Diagnosis not present

## 2023-01-28 DIAGNOSIS — S3991XA Unspecified injury of abdomen, initial encounter: Secondary | ICD-10-CM | POA: Diagnosis present

## 2023-01-28 DIAGNOSIS — Y990 Civilian activity done for income or pay: Secondary | ICD-10-CM | POA: Diagnosis not present

## 2023-01-28 DIAGNOSIS — W01198A Fall on same level from slipping, tripping and stumbling with subsequent striking against other object, initial encounter: Secondary | ICD-10-CM | POA: Insufficient documentation

## 2023-01-28 DIAGNOSIS — I10 Essential (primary) hypertension: Secondary | ICD-10-CM | POA: Diagnosis not present

## 2023-01-28 DIAGNOSIS — S31119A Laceration without foreign body of abdominal wall, unspecified quadrant without penetration into peritoneal cavity, initial encounter: Secondary | ICD-10-CM | POA: Insufficient documentation

## 2023-01-28 LAB — CBC WITH DIFFERENTIAL/PLATELET
Abs Immature Granulocytes: 0.03 10*3/uL (ref 0.00–0.07)
Basophils Absolute: 0 10*3/uL (ref 0.0–0.1)
Basophils Relative: 0 %
Eosinophils Absolute: 0.1 10*3/uL (ref 0.0–0.5)
Eosinophils Relative: 1 %
HCT: 44.4 % (ref 39.0–52.0)
Hemoglobin: 14.2 g/dL (ref 13.0–17.0)
Immature Granulocytes: 0 %
Lymphocytes Relative: 30 %
Lymphs Abs: 2.5 10*3/uL (ref 0.7–4.0)
MCH: 27.2 pg (ref 26.0–34.0)
MCHC: 32 g/dL (ref 30.0–36.0)
MCV: 85.1 fL (ref 80.0–100.0)
Monocytes Absolute: 0.7 10*3/uL (ref 0.1–1.0)
Monocytes Relative: 9 %
Neutro Abs: 5 10*3/uL (ref 1.7–7.7)
Neutrophils Relative %: 60 %
Platelets: 251 10*3/uL (ref 150–400)
RBC: 5.22 MIL/uL (ref 4.22–5.81)
RDW: 12.8 % (ref 11.5–15.5)
WBC: 8.4 10*3/uL (ref 4.0–10.5)
nRBC: 0 % (ref 0.0–0.2)

## 2023-01-28 LAB — BASIC METABOLIC PANEL
Anion gap: 9 (ref 5–15)
BUN: 18 mg/dL (ref 6–20)
CO2: 25 mmol/L (ref 22–32)
Calcium: 9.6 mg/dL (ref 8.9–10.3)
Chloride: 103 mmol/L (ref 98–111)
Creatinine, Ser: 0.79 mg/dL (ref 0.61–1.24)
GFR, Estimated: >60 mL/min (ref >60)
Glucose, Bld: 100 mg/dL — ABNORMAL HIGH (ref 70–99)
Potassium: 4.4 mmol/L (ref 3.5–5.1)
Sodium: 137 mmol/L (ref 135–145)

## 2023-01-28 MED ORDER — IOHEXOL 300 MG/ML  SOLN
100.0000 mL | Freq: Once | INTRAMUSCULAR | Status: AC | PRN
  Administered 2023-01-28: 100 mL via INTRAVENOUS

## 2023-01-28 MED ORDER — BACITRACIN ZINC 500 UNIT/GM EX OINT
TOPICAL_OINTMENT | Freq: Two times a day (BID) | CUTANEOUS | Status: DC
  Administered 2023-01-28: 1 via TOPICAL
  Filled 2023-01-28: qty 0.9

## 2023-01-28 MED ORDER — LIDOCAINE-EPINEPHRINE (PF) 2 %-1:200000 IJ SOLN
10.0000 mL | Freq: Once | INTRAMUSCULAR | Status: AC
  Administered 2023-01-28: 10 mL
  Filled 2023-01-28: qty 20

## 2023-01-28 MED ORDER — CEPHALEXIN 500 MG PO CAPS: 500.0000 mg | ORAL_CAPSULE | Freq: Three times a day (TID) | ORAL | 0 refills | Status: AC

## 2023-01-28 MED ORDER — TETANUS-DIPHTH-ACELL PERTUSSIS 5-2.5-18.5 LF-MCG/0.5 IM SUSY
0.5000 mL | PREFILLED_SYRINGE | Freq: Once | INTRAMUSCULAR | Status: AC
  Administered 2023-01-28: 0.5 mL via INTRAMUSCULAR
  Filled 2023-01-28: qty 0.5

## 2023-01-28 NOTE — ED Triage Notes (Signed)
Reports an abdominal  injury at work. He was lifting heavy object and fell with a ~2cm laceration vs puncture.   Small amounts of bleeding present.   Tetanus unknown.

## 2023-01-28 NOTE — Discharge Instructions (Addendum)
You had 4 stitches put into the wound on your abdominal wall.  These will need to be taken out in 7 days.  Notice any signs of infection such as fever without any other source, drainage from the wound, significant redness surrounding the wound that is getting worse please return to the emergency room weakness this is a sign for infection.  Your CT scan was reassuring.  I have sent antibiotic into the pharmacy for you to limit the chance of infection. Take Tylenol with ibuprofen as you need to for pain control.  Apply Neosporin over the wound to help with wound healing.

## 2023-01-28 NOTE — ED Provider Notes (Signed)
[place] EMERGENCY DEPARTMENT AT [facility] Provider Note   CSN: [id number] Arrival date & time: 01/28/23  0041     History  Chief Complaint  Patient presents with   Abdominal Injury    Steven Houston is a 43 y.o. male.  43 year old male presents today for concern of laceration to lower abdominal wall.  He states he was at work and he needed to place a box on a shelf.  He states given the hide the have to walk up on a platform.  He worked up on the platform and his right foot slipped causing him to fall back onto the slab.  He states he grabbed himself to keep himself from falling however there was a inch and 1/2 piece of metal that was sticking out from the guardrails that caught his stomach causing him to suffer a laceration.  He is unsure of his last tetanus shot.  Denies other complaints.  The history is provided by the patient. No language interpreter was used.       Home Medications Prior to Admission medications   Medication Sig Start Date End Date Taking? Authorizing Provider  losartan (COZAAR) 50 MG tablet Take 1 tablet (50 mg total) by mouth 2 (two) times daily. **NEEDS TO BE SEEN BEFORE NEXT REFILL** 12/22/22   Sonny Masters, FNP      Allergies    Haldol [haloperidol lactate] and Amlodipine    Review of Systems   Review of Systems  Constitutional:  Negative for chills and fever.  Gastrointestinal:  Negative for abdominal pain.  Skin:  Positive for wound.  All other systems reviewed and are negative.   Physical Exam Updated Vital Signs BP 137/85   Pulse 66   Temp 98.2 F (36.8 C) (Oral)   Resp 16   Ht 5\' 4"  (1.626 m)   Wt 113.4 kg   SpO2 99%   BMI 42.91 kg/m  Physical Exam Vitals and nursing note reviewed.  Constitutional:      General: He is not in acute distress.    Appearance: Normal appearance. He is not ill-appearing.  HENT:     Head: Normocephalic and atraumatic.     Nose: Nose normal.  Eyes:     Conjunctiva/sclera:  Conjunctivae normal.  Cardiovascular:     Rate and Rhythm: Normal rate.  Pulmonary:     Effort: Pulmonary effort is normal. No respiratory distress.  Musculoskeletal:        General: No deformity.  Skin:    Findings: No rash.     Comments: 3 cm lac to lower abdominal wall in the midline.  No active bleeding.  Neurological:     Mental Status: He is alert.     ED Results / Procedures / Treatments   Labs (all labs ordered are listed, but only abnormal results are displayed) Labs Reviewed  BASIC METABOLIC PANEL - Abnormal; Notable for the following components:      Result Value   Glucose, Bld 100 (*)    All other components within normal limits  CBC WITH DIFFERENTIAL/PLATELET    EKG None  Radiology CT ABDOMEN PELVIS W CONTRAST Result Date: 01/28/2023 CLINICAL DATA:  Penetrating abdominal trauma EXAM: CT ABDOMEN AND PELVIS WITH CONTRAST TECHNIQUE: Multidetector CT imaging of the abdomen and pelvis was performed using the standard protocol following bolus administration of intravenous contrast. RADIATION DOSE REDUCTION: This exam was performed according to the departmental dose-optimization program which includes automated exposure control, adjustment of the mA and/or kV  according to patient size and/or use of iterative reconstruction technique. CONTRAST:  OMNIPAQUE IOHEXOL 300 MG/ML  SOLN COMPARISON:  None Available. FINDINGS: Lower chest: Streaky opacity in the lower lungs attributed atelectasis. Hepatobiliary: No focal liver abnormality.No evidence of biliary obstruction or stone. Pancreas: Unremarkable. Spleen: Unremarkable. Adrenals/Urinary Tract: Negative adrenals. No hydronephrosis or stone. Unremarkable bladder. Stomach/Bowel:  No obstruction. No appendicitis. Vascular/Lymphatic: No acute vascular abnormality. No mass or adenopathy. Reproductive:No pathologic findings. Other: Subcutaneous reticulation with bubble of gas in the anterior abdominal midline reaching the muscular  abdominal wall air in the midline. No measurable hematoma. No evidence of intraperitoneal injury. Tiny umbilical hernia. Musculoskeletal: No acute abnormalities. IMPRESSION: Soft tissue wound with bubble of gas in the anterior abdominal midline approaching the deep fascia. No evidence of intraperitoneal injury. No hematoma or signs of active bleeding. Electronically Signed   By: Tiburcio Pea M.D.   On: 01/28/2023 04:49    Procedures .Marland KitchenLac repair Shresta Risden  Date/Time: 01/28/2023 10:09 AM  Performed by: Marita Kansas, PA-C Authorized by: Marita Kansas, PA-C   Consent:    Consent obtained:  Verbal   Consent given by:  Patient   Risks discussed:  Need for additional repair, infection, retained foreign body, poor cosmetic result and poor wound healing   Alternatives discussed:  No treatment Universal protocol:    Procedure explained and questions answered to patient or proxy's satisfaction: yes     Relevant documents present and verified: yes     Patient identity confirmed:  Verbally with patient and arm band Laceration details:    Location:  Trunk   Trunk location: lower mid abdomen.   Length (cm):  3 Pre-procedure details:    Preparation:  Patient was prepped and draped in usual sterile fashion Exploration:    Imaging outcome: foreign body not noted   Treatment:    Area cleansed with:  Saline and povidone-iodine   Amount of cleaning:  Extensive   Irrigation solution:  Sterile saline   Irrigation volume:  500   Irrigation method:  Tap   Debridement:  None   Undermining:  None Skin repair:    Repair method:  Sutures   Suture size:  5-0 and 4-0   Suture material:  Prolene   Suture technique:  Simple interrupted   Number of sutures:  4 Approximation:    Approximation:  Close Repair type:    Repair type:  Simple Post-procedure details:    Dressing:  Non-adherent dressing   Procedure completion:  Tolerated well, no immediate complications     Medications Ordered in ED Medications   iohexol (OMNIPAQUE) 300 MG/ML solution 100 mL (100 mLs Intravenous Contrast Given 01/28/23 0357)  lidocaine-EPINEPHrine (XYLOCAINE W/EPI) 2 %-1:200000 (PF) injection 10 mL (10 mLs Infiltration Given 01/28/23 0915)  Tdap (BOOSTRIX) injection 0.5 mL (0.5 mLs Intramuscular Given 01/28/23 0913)    ED Course/ Medical Decision Making/ A&P                                 Medical Decision Making Risk Prescription drug management.   Medical Decision Making / ED Course   This patient presents to the ED for concern of laceration to the abdominal wall, this involves an extensive number of treatment options, and is a complaint that carries with it a high risk of complications and morbidity.  The differential diagnosis includes intra-abdominal injury, superficial laceration  MDM: 43 year old male presents today for concern of injury to  his lower abdomen.  He has a 3 cm laceration.  Appears to be superficial.  CT was obtained.  No intra-abdominal concern.  Laceration was repaired with 4 sutures.  See procedure note above.  Wound care information discussed.  Discussed these will need to be taken out in 7 days.  Patient discharged in stable condition.  Return precaution discussed.  Patient voices understanding and is in agreement with plan.  Lab Tests: -I ordered, reviewed, and interpreted labs.   The pertinent results include:   Labs Reviewed  BASIC METABOLIC PANEL - Abnormal; Notable for the following components:      Result Value   Glucose, Bld 100 (*)    All other components within normal limits  CBC WITH DIFFERENTIAL/PLATELET      EKG  EKG Interpretation Date/Time:    Ventricular Rate:    PR Interval:    QRS Duration:    QT Interval:    QTC Calculation:   R Axis:      Text Interpretation:           Imaging Studies ordered: I ordered imaging studies including CT abdomen pelvis with contrast I independently visualized and interpreted imaging. I agree with the radiologist  interpretation   Medicines ordered and prescription drug management: Meds ordered this encounter  Medications   iohexol (OMNIPAQUE) 300 MG/ML solution 100 mL   lidocaine-EPINEPHrine (XYLOCAINE W/EPI) 2 %-1:200000 (PF) injection 10 mL   Tdap (BOOSTRIX) injection 0.5 mL   bacitracin ointment    -I have reviewed the patients home medicines and have made adjustments as needed  Reevaluation: After the interventions noted above, I reevaluated the patient and found that they have :stayed the same  Co morbidities that complicate the patient evaluation  Past Medical History:  Diagnosis Date   Bipolar disorder ([facility])    Hyperlipidemia    Hypertension       Dispostion: Discharged in stable condition.  Return precaution discussed.  Patient voices understanding and is in agreement with plan.   Final Clinical Impression(s) / ED Diagnoses Final diagnoses:  Laceration of abdominal wall, initial encounter    Rx / DC Orders ED Discharge Orders          Ordered    cephALEXin (KEFLEX) 500 MG capsule  3 times daily        01/28/23 1016              Marita Kansas, PA-C 01/28/23 1017    Merrell, Vittori A, DO 01/28/23 1640

## 2023-01-28 NOTE — ED Provider Triage Note (Signed)
Emergency Medicine Provider Triage Evaluation Note  Steven Houston , a 43 y.o. male  was evaluated in triage.  Pt complains of accidental penetrating wound to lower abdomen. Patient was moving a metal box which fell and hit him. He denies hitting his head or losing consciousness.  Review of Systems  Positive:  Negative:   Physical Exam  There were no vitals taken for this visit. Gen:   Awake, no distress   Resp:  Normal effort  MSK:   Moves extremities without difficulty  Other:  Penetrating wound noted to lower pannus  Medical Decision Making  Medically screening exam initiated at 1:43 AM.  Appropriate orders placed.  Esley Creger was informed that the remainder of the evaluation will be completed by another provider, this initial triage assessment does not replace that evaluation, and the importance of remaining in the ED until their evaluation is complete.     Darrick Grinder, PA-C 01/28/23 0145
